# Patient Record
Sex: Male | Born: 1985
Health system: Southern US, Community
[De-identification: ages and names within clinical notes are randomized; demographics above are authoritative.]

## PROBLEM LIST (undated history)

## (undated) DIAGNOSIS — I509 Heart failure, unspecified: Secondary | ICD-10-CM

## (undated) DIAGNOSIS — L02419 Cutaneous abscess of limb, unspecified: Secondary | ICD-10-CM

## (undated) HISTORY — DX: Heart failure, unspecified: I50.9

---

## 2012-07-07 ENCOUNTER — Encounter (HOSPITAL_COMMUNITY): Payer: Self-pay | Admitting: Emergency Medicine

## 2012-07-07 ENCOUNTER — Emergency Department (HOSPITAL_COMMUNITY)
Admission: EM | Admit: 2012-07-07 | Discharge: 2012-07-07 | Disposition: A | Discharge status: Home / Self Care | Payer: Self-pay | Attending: Emergency Medicine | Admitting: Emergency Medicine

## 2012-07-07 DIAGNOSIS — IMO0002 Reserved for concepts with insufficient information to code with codable children: Secondary | ICD-10-CM | POA: Insufficient documentation

## 2012-07-07 DIAGNOSIS — L02419 Cutaneous abscess of limb, unspecified: Secondary | ICD-10-CM

## 2012-07-07 DIAGNOSIS — Z91013 Allergy to seafood: Secondary | ICD-10-CM | POA: Insufficient documentation

## 2012-07-07 DIAGNOSIS — F17200 Nicotine dependence, unspecified, uncomplicated: Secondary | ICD-10-CM | POA: Insufficient documentation

## 2012-07-07 MED ORDER — HYDROCODONE-ACETAMINOPHEN 5-325 MG PO TABS: 1.0000 | ORAL_TABLET | Freq: Four times a day (QID) | ORAL | Status: AC | PRN

## 2012-07-07 NOTE — ED Provider Notes (Signed)
History     CSN: [id number]  Arrival date & time 07/07/12  1726   First MD Initiated Contact with Patient 07/07/12 2117      Chief Complaint  Patient presents with  . Abscess    (Consider location/radiation/quality/duration/timing/severity/associated sxs/prior treatment) HPI Comments: Patient has emergency department with chief complaint of abscess.  Onset of symptoms for about 3-4 days ago under the right axilla.  Patient states pain is 10/10 in severity and denies it current drainage, fevers, night sweats, chills, surrounding erythema, or warmth.  Patient is a 26 y.o. male presenting with abscess. The history is provided by the patient.  Abscess     History reviewed. No pertinent past medical history.  History reviewed. No pertinent past surgical history.  History reviewed. No pertinent family history.  History  Substance Use Topics  . Smoking status: Current Some Day Smoker    Types: Cigars  . Smokeless tobacco: Not on file  . Alcohol Use: Yes     occasion      Review of Systems  All other systems reviewed and are negative.    Allergies  Shrimp  Home Medications  No current outpatient prescriptions on file.  BP 129/72  Pulse 70  Temp 98.3 F (36.8 C) (Oral)  Resp 16  SpO2 98%  Physical Exam  Nursing note and vitals reviewed. Constitutional: He is oriented to person, place, and time. He appears well-developed and well-nourished. He does not have a sickly appearance. He does not appear ill. No distress.  HENT:  Head: Normocephalic and atraumatic.  Eyes: Conjunctivae and EOM are normal.  Neck: Normal range of motion. Neck supple.  Cardiovascular: Normal rate and regular rhythm.   Pulmonary/Chest: Effort normal and breath sounds normal.  Musculoskeletal: He exhibits no edema.  Lymphadenopathy:       Head (right side): No submental, no preauricular and no posterior auricular adenopathy present.       Head (left side): No submental, no submandibular,  no preauricular and no posterior auricular adenopathy present.    He has no axillary adenopathy.  Neurological: He is alert and oriented to person, place, and time.  Skin: Skin is warm and dry. No rash noted. He is not diaphoretic.       4x2cm sized abscess located on right axilla. Extreme tenderness to palpation. Not currently draining. Abscess is fluctuant without warmth, surrounding erythema, or induration.    ED Course  Procedures (including critical care time)  Labs Reviewed - No data to display No results found.   No diagnosis found. INCISION AND DRAINAGE Performed by: Jaci Carrel Consent: Verbal consent obtained. Risks and benefits: risks, benefits and alternatives were discussed Type: abscess  Body area: Right axilla  Anesthesia: local infiltration  Local anesthetic: lidocaine 2 % with epinephrine  Anesthetic total: 2 ml  Complexity: complex Blunt dissection to break up loculations  Drainage: purulent  Drainage amount: Moderate   Packing material: 1/4 in iodoform gauze  Patient tolerance: Patient tolerated the procedure well with no immediate complications.  '   MDM  Axilla abscess  Patient with skin abscess amenable to incision and drainage.  Abscess was large enough to warrant packing with removal and wound recheck in 2 days. No signs of cellulitis is surrounding skin.  Will d/c to home.  No antibiotic therapy is indicated.         Jaci Carrel, [place] 07/07/12 2216

## 2012-07-07 NOTE — ED Notes (Signed)
Pt reports for past 3-4 days noticed bump under R axilla; pt states last weekend had same but popped it and went away; pt has abscess to R axilla; red and firm to touch

## 2012-07-07 NOTE — ED Notes (Signed)
Spoke with PA, okay with all abscesses coming to [facility] as long as not perirectal

## 2012-07-08 NOTE — ED Provider Notes (Signed)
Medical screening examination/treatment/procedure(s) were performed by non-physician practitioner and as supervising physician I was immediately available for consultation/collaboration.  Aniko Finnigan, MD 07/08/12 0023 

## 2012-07-11 ENCOUNTER — Encounter (HOSPITAL_COMMUNITY): Payer: Self-pay | Admitting: *Deleted

## 2012-07-11 ENCOUNTER — Emergency Department (INDEPENDENT_AMBULATORY_CARE_PROVIDER_SITE_OTHER)
Admission: EM | Admit: 2012-07-11 | Discharge: 2012-07-11 | Disposition: A | Discharge status: Home / Self Care | Payer: Self-pay | Source: Home / Self Care | Attending: Emergency Medicine | Admitting: Emergency Medicine

## 2012-07-11 DIAGNOSIS — L0291 Cutaneous abscess, unspecified: Secondary | ICD-10-CM

## 2012-07-11 DIAGNOSIS — L0390 Cellulitis, unspecified: Secondary | ICD-10-CM

## 2012-07-11 HISTORY — DX: Cutaneous abscess of limb, unspecified: L02.419

## 2012-07-11 NOTE — ED Notes (Signed)
States here for packing removal; had I & D right axilla on 7/23.  States feeling much better.  Denies taking any abx; stopped taking Vicodin few days ago.

## 2012-07-11 NOTE — ED Provider Notes (Signed)
Chief Complaint  Patient presents with  . Abscess    History of Present Illness:    The patient is a 26 year old male who returns for removal of packing from an abscess in the right axilla. This was incised and drained 4 days ago the emergency department. He was not placed on antibiotic and no culture was done. He states the abscess feels better but is still a little bit irritated. He denies any fever or chills.  Review of Systems:  Other than noted above, the patient denies any of the following symptoms: Systemic:  No fever, chills or sweats. Skin:  No rash or itching.  PMFSH:  Past medical history, family history, social history, meds, and allergies were reviewed.  No history of diabetes or prior history of abscesses or MRSA.  Physical Exam:   Vital signs:  BP 100/66  Pulse 64  Temp 98.3 F (36.8 C) (Oral)  Resp 16  SpO2 100% Skin:  He has an abscess in his right axilla with packing in place, status post I&D. There still a little surrounding induration and slight pain to palpation.  Skin exam was otherwise normal.  No rash. Ext:  Distal pulses were full, patient has full ROM of all joints.  Procedure:  Verbal informed consent was obtained.  The patient was informed of the risks and benefits of the procedure and understands and accepts.  Identity of the patient was verified verbally and by wristband.   The packing was removed. The wound cavity appears clean with no exudate or drainage. This was then cleansed with saline, antibiotic ointment was applied and a sterile dry dressing. He was instructed in wound care.  Assessment:  The encounter diagnosis was Abscess.  Plan:   1.  The following meds were prescribed:   New Prescriptions   No medications on file   2.  The patient was instructed in symptomatic care and handouts were given. 3.  The patient was instructed to change the dressing once or twice a day and return again as needed.  Reuben Likes, MD 07/11/12 1726

## 2012-07-24 ENCOUNTER — Emergency Department (HOSPITAL_COMMUNITY)
Admission: EM | Admit: 2012-07-24 | Discharge: 2012-07-24 | Disposition: A | Discharge status: Home / Self Care | Payer: Self-pay | Attending: Emergency Medicine | Admitting: Emergency Medicine

## 2012-07-24 ENCOUNTER — Encounter (HOSPITAL_COMMUNITY): Payer: Self-pay

## 2012-07-24 DIAGNOSIS — L02411 Cutaneous abscess of right axilla: Secondary | ICD-10-CM

## 2012-07-24 DIAGNOSIS — IMO0002 Reserved for concepts with insufficient information to code with codable children: Secondary | ICD-10-CM | POA: Insufficient documentation

## 2012-07-24 DIAGNOSIS — F17200 Nicotine dependence, unspecified, uncomplicated: Secondary | ICD-10-CM | POA: Insufficient documentation

## 2012-07-24 MED ORDER — CLINDAMYCIN HCL 150 MG PO CAPS: 450.0000 mg | ORAL_CAPSULE | Freq: Two times a day (BID) | ORAL | Status: AC

## 2012-07-24 MED ORDER — HYDROCODONE-ACETAMINOPHEN 5-325 MG PO TABS: 2.0000 | ORAL_TABLET | ORAL | Status: AC | PRN

## 2012-07-24 NOTE — ED Provider Notes (Signed)
History     CSN: [id number]  Arrival date & time 07/24/12  0803   First MD Initiated Contact with Patient 07/24/12 0900      Chief Complaint  Patient presents with  . Abscess    (Consider location/radiation/quality/duration/timing/severity/associated sxs/prior treatment) Patient is a 26 y.o. male presenting with abscess. The history is provided by the patient and medical records.  Abscess  Pertinent negatives include no fever and no vomiting.   Marcus Hernandez is a 26 y.o. male presents to the emergency department complaining of multiple abscesses in the R axilla.  He recently had one in the same area that is healing.  The onset of the symptoms was  gradual starting 5 days ago.  The patient has associated pain.  The symptoms have been  persistent, gradually worsened.  Movement, clothing makes the symptoms worse and nothing makes symptoms better.  The patient denies fever, chills, diaphoresis, dizziness, lightneadeness.  He has taken pain pills for management of the discomfort.  .    The patient has medical history significant for:  Past Medical History  Diagnosis Date  . Axillary abscess      Past Medical History  Diagnosis Date  . Axillary abscess     History reviewed. No pertinent past surgical history.  No family history on file.  History  Substance Use Topics  . Smoking status: Current Some Day Smoker    Types: Cigars  . Smokeless tobacco: Not on file  . Alcohol Use: Yes     occasion      Review of Systems  Constitutional: Negative for fever.  Gastrointestinal: Negative for nausea and vomiting.  Skin: Positive for rash (abscess).    Allergies  Shrimp  Home Medications   Current Outpatient Rx  Name Route Sig Dispense Refill  . HYDROCODONE-ACETAMINOPHEN 5-325 MG PO TABS Oral Take 1 tablet by mouth every 6 (six) hours as needed. For pain      BP 110/68  Pulse 65  Temp 97.8 F (36.6 C) (Oral)  Resp 20  Ht 5\' 8"  (1.727 m)  Wt 140 lb (63.504 kg)   BMI 21.29 kg/m2  SpO2 100%  Physical Exam  Nursing note and vitals reviewed. Constitutional: He appears well-developed and well-nourished. No distress.  HENT:  Head: Normocephalic and atraumatic.  Eyes: Conjunctivae are normal. No scleral icterus.  Cardiovascular: Normal rate and regular rhythm.   Pulmonary/Chest: Effort normal and breath sounds normal.  Neurological: He is alert.  Skin: Skin is warm and dry. Rash ( abscess in the R axilla x 2, erythematous and tender to palpation.  Minimal accompanying cellulitis) noted. He is not diaphoretic. There is erythema.    ED Course  Procedures (including critical care time)  Labs Reviewed - No data to display No results found.  INCISION AND DRAINAGE Performed by: Dierdre Forth Consent: Verbal consent obtained. Risks and benefits: risks, benefits and alternatives were discussed Type: abscess x2  Body area: axilla  Anesthesia: local infiltration  Local anesthetic: lidocaine 2% without epinephrine  Anesthetic total: 3 ml  Complexity: complex Blunt dissection to break up loculations  Drainage: purulent  Drainage amount: 5cc between the 2 places  Packing material: 1/4 in iodoform gauze in each abscess  Patient tolerance: Patient tolerated the procedure well with no immediate complications.  1. Abscess of axilla, right       MDM  Marcus Hernandez presents with 2 abscesses under the right axilla.  This is a recurrent problem particularly in this area of the  body he has had I&D of these areas before.  I&D of both areas today without complication today. Discharge home with an antibiotic and pain medicine.  Indications to followup immediately to the emergency room and he states understanding.    1. Medications: clindamycin 2. Treatment: warm compress, change bandage as needed 3. Follow Up: with ER for re-check and packing removal in 2 days         Dierdre Forth, PA-C 07/24/12 1123

## 2012-07-24 NOTE — ED Notes (Signed)
I and D done by [facility], patient tolerated the procedure well.

## 2012-07-24 NOTE — ED Notes (Signed)
Patient presented to the ER with complaint of abscess to rt armpit. Patient denies any fever. Patient was seen in the ER 2 weeks ago for the same problem. NAD.

## 2012-07-28 NOTE — ED Provider Notes (Signed)
Medical screening examination/treatment/procedure(s) were performed by non-physician practitioner and as supervising physician I was immediately available for consultation/collaboration.   Laray Anger, DO 07/28/12 [phone]

## 2020-08-11 ENCOUNTER — Other Ambulatory Visit: Payer: Self-pay

## 2020-08-11 DIAGNOSIS — Z20822 Contact with and (suspected) exposure to covid-19: Secondary | ICD-10-CM

## 2020-08-12 LAB — SARS-COV-2, NAA 2 DAY TAT

## 2020-08-12 LAB — NOVEL CORONAVIRUS, NAA: SARS-CoV-2, NAA: DETECTED — AB

## 2020-10-20 ENCOUNTER — Other Ambulatory Visit: Payer: Self-pay

## 2020-10-20 ENCOUNTER — Emergency Department (HOSPITAL_BASED_OUTPATIENT_CLINIC_OR_DEPARTMENT_OTHER)
Admission: EM | Admit: 2020-10-20 | Discharge: 2020-10-21 | Disposition: A | Discharge status: Home / Self Care | Payer: Self-pay | Attending: Emergency Medicine | Admitting: Emergency Medicine

## 2020-10-20 ENCOUNTER — Emergency Department (HOSPITAL_BASED_OUTPATIENT_CLINIC_OR_DEPARTMENT_OTHER): Payer: Self-pay

## 2020-10-20 ENCOUNTER — Encounter (HOSPITAL_BASED_OUTPATIENT_CLINIC_OR_DEPARTMENT_OTHER): Payer: Self-pay

## 2020-10-20 DIAGNOSIS — R079 Chest pain, unspecified: Secondary | ICD-10-CM | POA: Insufficient documentation

## 2020-10-20 DIAGNOSIS — Z8616 Personal history of COVID-19: Secondary | ICD-10-CM | POA: Insufficient documentation

## 2020-10-20 DIAGNOSIS — R0602 Shortness of breath: Secondary | ICD-10-CM | POA: Insufficient documentation

## 2020-10-20 DIAGNOSIS — Z87891 Personal history of nicotine dependence: Secondary | ICD-10-CM | POA: Insufficient documentation

## 2020-10-20 LAB — BASIC METABOLIC PANEL
Anion gap: 11 (ref 5–15)
BUN: 10 mg/dL (ref 6–20)
CO2: 26 mmol/L (ref 22–32)
Calcium: 9.8 mg/dL (ref 8.9–10.3)
Chloride: 99 mmol/L (ref 98–111)
Creatinine, Ser: 1.17 mg/dL (ref 0.61–1.24)
GFR, Estimated: >60 mL/min (ref >60)
Glucose, Bld: 111 mg/dL — ABNORMAL HIGH (ref 70–99)
Potassium: 4.1 mmol/L (ref 3.5–5.1)
Sodium: 136 mmol/L (ref 135–145)

## 2020-10-20 LAB — CBC
HCT: 45.8 % (ref 39.0–52.0)
Hemoglobin: 14.4 g/dL (ref 13.0–17.0)
MCH: 23.2 pg — ABNORMAL LOW (ref 26.0–34.0)
MCHC: 31.4 g/dL (ref 30.0–36.0)
MCV: 73.8 fL — ABNORMAL LOW (ref 80.0–100.0)
Platelets: 349 10*3/uL (ref 150–400)
RBC: 6.21 MIL/uL — ABNORMAL HIGH (ref 4.22–5.81)
RDW: 15.7 % — ABNORMAL HIGH (ref 11.5–15.5)
WBC: 7.7 10*3/uL (ref 4.0–10.5)
nRBC: 0 % (ref 0.0–0.2)

## 2020-10-20 LAB — TROPONIN I (HIGH SENSITIVITY)
Troponin I (High Sensitivity): <2 ng/L (ref <18)
Troponin I (High Sensitivity): 2 ng/L (ref <18)

## 2020-10-20 LAB — D-DIMER, QUANTITATIVE: D-Dimer, Quant: 0.32 ug/mL-FEU (ref 0.00–0.50)

## 2020-10-20 LAB — CBC WITH DIFFERENTIAL/PLATELET
Abs Immature Granulocytes: 0.02 10*3/uL (ref 0.00–0.07)
Basophils Absolute: 0 10*3/uL (ref 0.0–0.1)
Basophils Relative: 0 %
Eosinophils Absolute: 0 10*3/uL (ref 0.0–0.5)
Eosinophils Relative: 0 %
HCT: 42.4 % (ref 39.0–52.0)
Hemoglobin: 14.5 g/dL (ref 13.0–17.0)
Immature Granulocytes: 0 %
Lymphocytes Relative: 26 %
Lymphs Abs: 2 10*3/uL (ref 0.7–4.0)
MCH: 25.4 pg — ABNORMAL LOW (ref 26.0–34.0)
MCHC: 34.2 g/dL (ref 30.0–36.0)
MCV: 74.3 fL — ABNORMAL LOW (ref 80.0–100.0)
Monocytes Absolute: 0.9 10*3/uL (ref 0.1–1.0)
Monocytes Relative: 12 %
Neutro Abs: 4.6 10*3/uL (ref 1.7–7.7)
Neutrophils Relative %: 62 %
Platelets: 329 10*3/uL (ref 150–400)
RBC: 5.71 MIL/uL (ref 4.22–5.81)
RDW: 15.8 % — ABNORMAL HIGH (ref 11.5–15.5)
WBC: 7.5 10*3/uL (ref 4.0–10.5)
nRBC: 0 % (ref 0.0–0.2)

## 2020-10-20 MED ORDER — ONDANSETRON HCL 4 MG/2ML IJ SOLN
4.0000 mg | Freq: Once | INTRAMUSCULAR | Status: DC
  Filled 2020-10-20: qty 2

## 2020-10-20 MED ORDER — KETOROLAC TROMETHAMINE 30 MG/ML IJ SOLN
30.0000 mg | Freq: Once | INTRAMUSCULAR | Status: AC
  Administered 2020-10-20: 30 mg via INTRAVENOUS
  Filled 2020-10-20: qty 1

## 2020-10-20 NOTE — ED Provider Notes (Signed)
[facility] EMERGENCY DEPARTMENT Provider Note   CSN: [id number] Arrival date & time: 10/20/20  2011     History Chief Complaint  Patient presents with  . Chest Pain    Marcus Hernandez is a 34 y.o. male with no significant past medical history presents for evaluation of chest pain and shortness of breath.  He reports over the last year or so, he has had intermittent shortness of breath.  He states that over the last week, he felt like it is gotten worse.  He states that about a week ago, he started experiencing chest pain.  He states that chest pain has been intermittent since then.  He states it occurs randomly is not tied to a specific action.  He states he has had it when he has been at home, laying on the bed, laying on the couch, when he is in his car and another different times.  He states sometimes he has it after eating.  He states is not specifically tied to when he does any exertional activity.  He reports that when he gets it, he does not have any associated diaphoresis, nausea/vomiting.  He states that it will last for different amounts of time.  Sometimes it is a second, sometimes it is more of a minute sometimes it takes an hour or so.  He states that sometimes it feels like a heating pain, a sharp pain sometimes a squeezing pain.  He states he does not take any medications for the symptoms.  He went to urgent care on 10/17/2020 and was evaluated for the symptoms.  He states that he was discharged with paperwork that states that he was diagnosed with GERD as well as stating that he had a thyroid goiter and unspecified chest pain.  He states he still does not know what is causing his pain.  He states he has continued to have some intermittent episodes.  He states that shortness of breath happens when he is sitting and when he is walking as well.  He states that the chest pain is intermittently occurring.  He states that when he has it is not worse with exertion.  He states  sometimes the pain is worse with deep inspiration sometimes it is not.  He has not any fevers, cough, chills.  Denies any abdominal pain, nausea/vomiting.  He reports that sometimes he will get a tingling sensation in his chest when he gets symptoms but currently does not have it.  He states he had Covid a couple months ago.  He denies any exogenous hormone use, recent immobilization, prior history of DVT/PE, recent surgery, leg swelling, or long travel.  He does not smoke.  Denies any cocaine, heroin, marijuana use.  No personal cardiac history.  Does not think he has had any family history of MI before the age of 26.  No history of hypertension, diabetes.  The history is provided by the patient.       Past Medical History:  Diagnosis Date  . Axillary abscess     There are no problems to display for this patient.   History reviewed. No pertinent surgical history.     No family history on file.  Social History   Tobacco Use  . Smoking status: Former Smoker    Types: Cigars  . Smokeless tobacco: Never Used  Vaping Use  . Vaping Use: Never used  Substance Use Topics  . Alcohol use: Yes    Comment: weekly  . Drug use:  No    Home Medications Prior to Admission medications   Medication Sig Start Date End Date Taking? Authorizing Provider  HYDROcodone-acetaminophen (NORCO/VICODIN) 5-325 MG per tablet Take 1 tablet by mouth every 6 (six) hours as needed. For pain    [provider]    Allergies    Shrimp [shellfish allergy]  Review of Systems   Review of Systems  Constitutional: Negative for fever.  Respiratory: Positive for shortness of breath. Negative for cough.   Cardiovascular: Positive for chest pain. Negative for leg swelling.  Gastrointestinal: Negative for abdominal pain, nausea and vomiting.  Genitourinary: Negative for dysuria and hematuria.  Neurological: Negative for headaches.  All other systems reviewed and are negative.   Physical Exam Updated  Vital Signs BP 121/66 (BP Location: Left Arm)   Pulse 87   Temp 99.7 F (37.6 C) (Oral)   Resp 18   Ht 5\' 7"  (1.702 m)   Wt 75.8 kg   SpO2 100%   BMI 26.16 kg/m   Physical Exam Vitals and nursing note reviewed.  Constitutional:      Appearance: Normal appearance. He is well-developed.  HENT:     Head: Normocephalic and atraumatic.  Eyes:     General: Lids are normal.     Conjunctiva/sclera: Conjunctivae normal.     Pupils: Pupils are equal, round, and reactive to light.  Cardiovascular:     Rate and Rhythm: Normal rate and regular rhythm.     Pulses: Normal pulses.          Radial pulses are 2+ on the right side and 2+ on the left side.       Dorsalis pedis pulses are 2+ on the right side and 2+ on the left side.     Heart sounds: Normal heart sounds. No murmur heard.  No friction rub. No gallop.   Pulmonary:     Effort: Pulmonary effort is normal.     Breath sounds: Normal breath sounds.     Comments: Lungs clear to auscultation bilaterally.  Symmetric chest rise.  No wheezing, rales, rhonchi. Abdominal:     Palpations: Abdomen is soft. Abdomen is not rigid.     Tenderness: There is no abdominal tenderness. There is no guarding.     Comments: Abdomen is soft, non-distended, non-tender. No rigidity, No guarding. No peritoneal signs.  Musculoskeletal:        General: Normal range of motion.     Cervical back: Full passive range of motion without pain.  Skin:    General: Skin is warm and dry.     Capillary Refill: Capillary refill takes less than 2 seconds.  Neurological:     Mental Status: He is alert and oriented to person, place, and time.  Psychiatric:        Speech: Speech normal.     ED Results / Procedures / Treatments   Labs (all labs ordered are listed, but only abnormal results are displayed) Labs Reviewed  BASIC METABOLIC PANEL - Abnormal; Notable for the following components:      Result Value   Glucose, Bld 111 (*)    All other components within  normal limits  CBC - Abnormal; Notable for the following components:   RBC 6.21 (*)    MCV 73.8 (*)    MCH 23.2 (*)    RDW 15.7 (*)    All other components within normal limits  CBC WITH DIFFERENTIAL/PLATELET - Abnormal; Notable for the following components:   MCV 74.3 (*)  MCH 25.4 (*)    RDW 15.8 (*)    All other components within normal limits  D-DIMER, QUANTITATIVE (NOT AT [facility])  TROPONIN I (HIGH SENSITIVITY)  TROPONIN I (HIGH SENSITIVITY)    EKG EKG Interpretation  Date/Time:  Friday October 20 2020 20:16:01 EDT Ventricular Rate:  109 PR Interval:  136 QRS Duration: 90 QT Interval:  318 QTC Calculation: 428 R Axis:   96 Text Interpretation: Sinus tachycardia Right atrial enlargement Rightward axis Borderline ECG No old tracing to compare Confirmed by Malvin Johns [phone]) on 10/20/2020 10:54:37 PM   Radiology DG Chest 2 View  Result Date: 10/20/2020 CLINICAL DATA:  Mid chest pain and shortness of breath. EXAM: CHEST - 2 VIEW COMPARISON:  None. FINDINGS: The cardiomediastinal contours are normal. The lungs are clear. Pulmonary vasculature is normal. No consolidation, pleural effusion, or pneumothorax. No acute osseous abnormalities are seen. IMPRESSION: Negative radiographs of the chest. Electronically Signed   By: Keith Rake M.D.   On: 10/20/2020 20:58    Procedures Procedures (including critical care time)  Medications Ordered in ED Medications  ondansetron (ZOFRAN) injection 4 mg (4 mg Intravenous Not Given 10/20/20 2217)  ketorolac (TORADOL) 30 MG/ML injection 30 mg (30 mg Intravenous Given 10/20/20 2209)    ED Course  I have reviewed the triage vital signs and the nursing notes.  Pertinent labs & imaging results that were available during my care of the patient were reviewed by me and considered in my medical decision making (see chart for details).    MDM Rules/Calculators/A&P                          34 year old male who presents for evaluation  of chest pain, shortness of breath.  Reports shortness of breath has been intermittently occurring for about a year.  Chest pain started about a week ago.  It is not tied to any specific action.  He states it will resolve on its own.  He saw urgent care on 10/17/2020 but states that he does not know what is causing his pain.  Initial arrival, he is afebrile nontoxic-appearing.  Vital signs are stable.  On exam, lungs clear to auscultation.  Low suspicion for ACS etiology as he has atypical features.  History/physical exam is x-ray for dissection.  Low suspicion for PE as he does not have any risk factors but had recent COVID-19 diagnosis and does report some shortness of breath pleuritic chest pain.  We will plan check labs, dimer, EKG, chest x-ray.  I reviewed his work-up from urgent care.  He had TSH, CBC, BMP done as well as a chest x-ray.  There was some concern on the chest x-ray that he may have a thyroid goiter which was listed as one of his diagnoses.  I do not see any evidence of troponin ordered.  Initial troponin negative.  D-dimer is negative.  CBC shows no leukocytosis or anemia.  RBC count is 5.71 here today.  BMP shows BUN and creatinine within normal limits.  Chest x-ray negative for any acute abnormalities.  Delta troponin negative.  Discussed results with patient.  He is sitting comfortably on bed with no signs of distress.  Repeat exam is benign.  He is hemodynamically stable here in the ED.  I did extensive discussion with him regarding his work-up here in ED.  At this time, given his reassuring work-up, do not feel he needs further evaluation in the emergency department.  He  does report that he does not feel as bad as has been.  We discussed that there may be an anxiety component and he does report that he feels like sometimes he will start to think about things and he cannot turn his mind off.  He does not have a primary care doctor.  I discussed with him that it might be helpful in  obtaining a primary care doctor to follow-up on the referred thyroid issues that were seen at the previous urgent care as well as potentially getting a Holter monitor to evaluate when he does have symptoms. At this time, patient exhibits no emergent life-threatening condition that require further evaluation in ED. Discussed patient with Dr. Tamera Punt who is agreeable to plan. Patient had ample opportunity for questions and discussion. All patient's questions were answered with full understanding. Strict return precautions discussed. Patient expresses understanding and agreement to plan.   Portions of this note were generated with [employer]. Dictation errors may occur despite best attempts at proofreading.    Final Clinical Impression(s) / ED Diagnoses Final diagnoses:  Nonspecific chest pain    Rx / DC Orders ED Discharge Orders    None       Volanda Napoleon, PA-C 10/21/20 0049    Malvin Johns, MD 10/21/20 [phone]

## 2020-10-20 NOTE — ED Notes (Signed)
Patient transported to X-ray 

## 2020-10-20 NOTE — ED Notes (Signed)
Pt ambulated with pulse ox. O2 saturation 97% or above. HR remained between 90-101.

## 2020-10-20 NOTE — ED Triage Notes (Signed)
Pt c/o CP, SOB x 3-4 days-denies fever/flu sx-NAD-steady gait

## 2020-10-21 NOTE — ED Notes (Signed)
Discharge instructions discussed. Follow up care reviewed. Departs ED at this time in stable condition with instructions on when or if to return.

## 2020-10-21 NOTE — Discharge Instructions (Signed)
Your workup today looked reassuring.   As we discussed, based on your previous work-up from urgent care, you have a thyroid goiter/nodule.  This needs to be followed up by your primary care doctor.  As we discussed, it also may be beneficial to get a primary care doctor to do a Holter monitor if you continue of symptoms.  Return the emergency department for any worsening pain, difficulty breathing, numbness/weakness of your arms or legs, vomiting, abdominal pain or any other worsening concerning symptoms.

## 2020-10-31 ENCOUNTER — Other Ambulatory Visit: Payer: Self-pay

## 2020-10-31 ENCOUNTER — Other Ambulatory Visit: Payer: Self-pay | Admitting: Internal Medicine

## 2020-10-31 ENCOUNTER — Ambulatory Visit: Payer: Self-pay | Attending: Internal Medicine | Admitting: Internal Medicine

## 2020-10-31 ENCOUNTER — Encounter: Payer: Self-pay | Admitting: Internal Medicine

## 2020-10-31 VITALS — BP 120/79 | HR 96 | Resp 16 | Ht 66.0 in | Wt 160.6 lb

## 2020-10-31 DIAGNOSIS — E010 Iodine-deficiency related diffuse (endemic) goiter: Secondary | ICD-10-CM

## 2020-10-31 DIAGNOSIS — R1314 Dysphagia, pharyngoesophageal phase: Secondary | ICD-10-CM

## 2020-10-31 DIAGNOSIS — Z2821 Immunization not carried out because of patient refusal: Secondary | ICD-10-CM

## 2020-10-31 DIAGNOSIS — R634 Abnormal weight loss: Secondary | ICD-10-CM

## 2020-10-31 DIAGNOSIS — R0602 Shortness of breath: Secondary | ICD-10-CM

## 2020-10-31 DIAGNOSIS — R079 Chest pain, unspecified: Secondary | ICD-10-CM

## 2020-10-31 MED ORDER — SERTRALINE HCL 50 MG PO TABS: ORAL_TABLET | ORAL | 2 refills | Status: DC

## 2020-10-31 MED FILL — SERTRALINE HCL 50 MG TABLET: 50 | 20 days supply | Qty: 10 | Fill #0

## 2020-10-31 NOTE — Progress Notes (Signed)
Patient ID: NASEEM VARDEN, male    DOB: [birth year]  MRN: [id number]  CC: Hospitalization Follow-up (ED)   Subjective: Zacariah Morua is a 34 y.o. male who presents for new pt visit. Male friend is with him.  His concerns today include:   No previous PCP. No previous chronic ds, not on any meds Had COVID end of August 2021  Presents today for eval of SOB and chest pain.  Seen in ER 12/21/2019 for same.  The PA who saw him took an excellent history which I have reviewed.  Studies including CBC, troponin, D-dimer, chemistry were normal.  Chest x-ray revealed no acute disease.  EKG revealed borderline LVH and sinus tachycardia.  There was no old previous EKG to compare.  He was assessed to possibly have an anxiety component and was told to establish with a PCP.  Today:  CP started end of October SOB intermittent x1-1.5 yrs. Associated with tingling in various parts of the body lasting 1-2 mins.  He states that most of the tingling occurs in the anterior chest and in his stomach.   Symptoms have occurred daily since end of October. SOB more when moving around but can come on when sitting or laying down,riding in his car and when taking a shower.  The chest discomfort he describes as a tightness and pressure but sometimes it can be sharp and other times can feel like a strain.   Most noticeable when taking a shower and when riding in his car. Does [employer].  Sometimes while in his car he can feel his throat tightening up and he has to get out of the car or drive out of the area that he is in.  Symptoms disappear after several minutes.  He also endorses episodes of waking up in the middle of the night feeling like he cannot breathe.  Feeling goes away once he gets up and drink some water.  He has not been going out much in the past few weeks because he is worried about when the next episode may occur. No cough or fever.  Rode to [facility] this wkend.  No swelling in legs.  He is  not very active. Endorses increased stress over the past 1 year.   Reports his weight was 171 pounds 2 weeks ago.  Today he weighs 160 pounds.  He reports that he is not eating due to decreased appetite.  He also reports feeling of food being stuck in his throat when he tries to eat for the past 1 week.  He sometimes gets the feeling of chest pains or shortness of breath when he starts to eat.  He denies any abdominal pain.  No blood in his stools.  Denies any street drug use.  He does not smoke.  He has 3 kids. Sister has overactive thyroid.  He brings a copy of labs that he had done on 10/17/2020 at an urgent care facility call [facility].  TSH was normal at 2.9, chemistry was normal.  Lipase normal.  CBC normal.  Past medical, surgical, social and family history reviewed.  Patient Active Problem List   Diagnosis Date Noted  . Influenza vaccine refused 10/31/2020     Current Outpatient Medications on File Prior to Visit  Medication Sig Dispense Refill  . HYDROcodone-acetaminophen (NORCO/VICODIN) 5-325 MG per tablet Take 1 tablet by mouth every 6 (six) hours as needed. For pain (Patient not taking: Reported on 10/31/2020)     No  current facility-administered medications on file prior to visit.    Allergies  Allergen Reactions  . Shrimp [Shellfish Allergy] Swelling    To lips    Social History   Socioeconomic History  . Marital status: Single    Spouse name: Not on file  . Number of children: Not on file  . Years of education: Not on file  . Highest education level: Not on file  Occupational History  . Not on file  Tobacco Use  . Smoking status: Former Smoker    Types: Cigars  . Smokeless tobacco: Never Used  Vaping Use  . Vaping Use: Never used  Substance and Sexual Activity  . Alcohol use: Yes    Comment: weekly  . Drug use: No  . Sexual activity: Not on file  Other Topics Concern  . Not on file  Social History Narrative  . Not on file   Social  Determinants of Health   Financial Resource Strain:   . Difficulty of Paying Living Expenses: Not on file  Food Insecurity:   . Worried About Charity fundraiser in the Last Year: Not on file  . Ran Out of Food in the Last Year: Not on file  Transportation Needs:   . Lack of Transportation (Medical): Not on file  . Lack of Transportation (Non-Medical): Not on file  Physical Activity:   . Days of Exercise per Week: Not on file  . Minutes of Exercise per Session: Not on file  Stress:   . Feeling of Stress : Not on file  Social Connections:   . Frequency of Communication with Friends and Family: Not on file  . Frequency of Social Gatherings with Friends and Family: Not on file  . Attends Religious Services: Not on file  . Active Member of Clubs or Organizations: Not on file  . Attends Archivist Meetings: Not on file  . Marital Status: Not on file  Intimate Partner Violence:   . Fear of Current or Ex-Partner: Not on file  . Emotionally Abused: Not on file  . Physically Abused: Not on file  . Sexually Abused: Not on file    No family history on file.  No past surgical history on file.  ROS: Review of Systems Negative except as stated above  PHYSICAL EXAM: BP 120/79   Pulse 96   Resp 16   Ht 5\' 6"  (1.676 m)   Wt 160 lb 9.6 oz (72.8 kg)   SpO2 97%   BMI 25.92 kg/m   Wt Readings from Last 3 Encounters:  10/31/20 160 lb 9.6 oz (72.8 kg)  10/20/20 167 lb (75.8 kg)  07/24/12 140 lb (63.5 kg)    Physical Exam   General appearance - alert, well appearing, young to middle-aged African-American male and in no distress Mental status - normal mood, behavior, speech, dress, motor activity, and thought processes Eyes - pupils equal and reactive, extraocular eye movements intact Nose - normal and patent, no erythema, discharge or polyps Mouth - mucous membranes moist, pharynx normal without lesions Neck - supple, no significant adenopathy.  Thyroid is enlarged more  so on the right side.  No thyroid nodules appreciated. Chest - clear to auscultation, no wheezes, rales or rhonchi, symmetric air entry Heart - normal rate, regular rhythm, normal S1, S2, no murmurs, rubs, clicks or gallops Abdomen - soft, nontender, nondistended, no masses or organomegaly Extremities - peripheral pulses normal, no pedal edema, no clubbing or cyanosis Depression screen [facility] 2/9 10/31/2020  Decreased Interest 3  Down, Depressed, Hopeless 1  PHQ - 2 Score 4  Altered sleeping 3  Tired, decreased energy 2  Change in appetite 2  Feeling bad or failure about yourself  1  Trouble concentrating 2  Moving slowly or fidgety/restless 0  Suicidal thoughts 0  PHQ-9 Score 14   GAD 7 : Generalized Anxiety Score 10/31/2020  Nervous, Anxious, on Edge 1  Control/stop worrying 2  Worry too much - different things 2  Trouble relaxing 1  Restless 0  Easily annoyed or irritable 0  Afraid - awful might happen 2  Total GAD 7 Score 8      CMP Latest Ref Rng & Units 10/20/2020  Glucose 70 - 99 mg/dL 111(H)  BUN 6 - 20 mg/dL 10  Creatinine 0.61 - 1.24 mg/dL 1.17  Sodium 135 - 145 mmol/L 136  Potassium 3.5 - 5.1 mmol/L 4.1  Chloride 98 - 111 mmol/L 99  CO2 22 - 32 mmol/L 26  Calcium 8.9 - 10.3 mg/dL 9.8   Lipid Panel  No results found for: CHOL, TRIG, HDL, CHOLHDL, VLDL, LDLCALC, LDLDIRECT  CBC    Component Value Date/Time   WBC 7.5 10/20/2020 2041   RBC 5.71 10/20/2020 2041   HGB 14.5 10/20/2020 2041   HCT 42.4 10/20/2020 2041   PLT 329 10/20/2020 2041   MCV 74.3 (L) 10/20/2020 2041   MCH 25.4 (L) 10/20/2020 2041   MCHC 34.2 10/20/2020 2041   RDW 15.8 (H) 10/20/2020 2041   LYMPHSABS 2.0 10/20/2020 2041   MONOABS 0.9 10/20/2020 2041   EOSABS 0.0 10/20/2020 2041   BASOSABS 0.0 10/20/2020 2041    ASSESSMENT AND PLAN: 1. Chest pain in adult 2. SOB (shortness of breath) -Symptoms seem most consistent with anxiety/panic disorder.  I summarized his symptoms and explained  to him why I think this may be anxiety.  He asked about long-haul a syndrome given that he had COVID-19 infection back in August not requiring hospitalization.  However most of his symptoms per his history started even before he had Covid.  I recommend starting low-dose of Zoloft to see if symptoms will improve.  Patient is agreeable to giving the medication a try.  I told him that it will take about 3 to 4 weeks before he starts feeling better on the medication.  We will have him take half a pill daily for 3 weeks then increase it to a full pill.  I will also have our LCSW follow-up with him. His EKG does have some LVH so we will refer him to cardiology.  Patient advised to apply for the orange card/cone discount. - Ambulatory referral to Cardiology - sertraline (ZOLOFT) 50 MG tablet; 1/2 tab daily x 3 wks then 1 tab PO daily  Dispense: 30 tablet; Refill: 2  3. Pharyngoesophageal dysphagia 4. Weight loss The weight loss is concerning.  Anxiety may be playing a role here also however given the dysphagia to solids I recommend that we do a barium swallow to evaluate this further.  The thyroid gland does appear enlarged more so on the right side but not to the extent that I see it causing any shift in his trachea. - DG ESOPHAGUS W SINGLE CM (SOL OR THIN BA); Future  5. Thyromegaly Recent TSH normal.  I have kept a copy of those lab results for our records. - US THYROID; Future  6. Influenza vaccine refused This was recommended.  Patient declined       Patient was  given the opportunity to ask questions.  Patient verbalized understanding of the plan and was able to repeat key elements of the plan.   Orders Placed This Encounter  Procedures  . US THYROID  . DG ESOPHAGUS W SINGLE CM (SOL OR THIN BA)  . Ambulatory referral to Cardiology     Requested Prescriptions   Signed Prescriptions Disp Refills  . sertraline (ZOLOFT) 50 MG tablet 30 tablet 2    Sig: 1/2 tab daily x 3 wks then 1 tab  PO daily    Return in about 6 weeks (around 12/12/2020).  Karle Plumber, MD, FACP

## 2020-10-31 NOTE — Patient Instructions (Signed)
I have sent a prescription to your pharmacy for the medication Zoloft.  This is a medication that can be used to treat anxiety.  You will take a half a tablet daily for the first 3 weeks and then after that 1 full tablet a day.  I have referred you to the cardiologist.  I have referred you for barium swallow and for an ultrasound on your thyroid.

## 2020-11-01 ENCOUNTER — Encounter: Payer: Self-pay | Admitting: Internal Medicine

## 2020-11-01 DIAGNOSIS — R0602 Shortness of breath: Secondary | ICD-10-CM | POA: Insufficient documentation

## 2020-11-01 DIAGNOSIS — R1314 Dysphagia, pharyngoesophageal phase: Secondary | ICD-10-CM | POA: Insufficient documentation

## 2020-11-01 DIAGNOSIS — R634 Abnormal weight loss: Secondary | ICD-10-CM | POA: Insufficient documentation

## 2020-11-01 DIAGNOSIS — E010 Iodine-deficiency related diffuse (endemic) goiter: Secondary | ICD-10-CM | POA: Insufficient documentation

## 2020-11-01 DIAGNOSIS — R079 Chest pain, unspecified: Secondary | ICD-10-CM | POA: Insufficient documentation

## 2020-11-06 ENCOUNTER — Other Ambulatory Visit: Payer: Self-pay

## 2020-11-08 ENCOUNTER — Ambulatory Visit: Payer: Self-pay | Attending: Internal Medicine

## 2020-11-08 ENCOUNTER — Other Ambulatory Visit: Payer: Self-pay

## 2020-11-10 ENCOUNTER — Other Ambulatory Visit: Payer: Self-pay | Admitting: Internal Medicine

## 2020-11-10 ENCOUNTER — Ambulatory Visit (HOSPITAL_COMMUNITY)
Admission: RE | Admit: 2020-11-10 | Discharge: 2020-11-10 | Disposition: A | Discharge status: Home / Self Care | Payer: Self-pay | Source: Ambulatory Visit | Attending: Internal Medicine | Admitting: Internal Medicine

## 2020-11-10 ENCOUNTER — Other Ambulatory Visit: Payer: Self-pay

## 2020-11-10 DIAGNOSIS — R1314 Dysphagia, pharyngoesophageal phase: Secondary | ICD-10-CM

## 2020-11-10 DIAGNOSIS — E010 Iodine-deficiency related diffuse (endemic) goiter: Secondary | ICD-10-CM | POA: Insufficient documentation

## 2020-11-12 ENCOUNTER — Other Ambulatory Visit: Payer: Self-pay | Admitting: Internal Medicine

## 2020-11-12 DIAGNOSIS — E010 Iodine-deficiency related diffuse (endemic) goiter: Secondary | ICD-10-CM

## 2020-11-12 NOTE — Progress Notes (Signed)
Cardiology Office Note:    Date:  11/16/2020   ID:  Marcus Hernandez, DOB 04/21/1986, MRN [id number]  PCP:  Ladell Pier, MD  Cardiologist:  No primary care provider on file.  Electrophysiologist:  None   Referring MD: Ladell Pier, MD   Chief Complaint  Patient presents with  . Chest Pain   History of Present Illness:    Marcus Hernandez is a 34 y.o. male with a hx of COVID-19 infection 07/2020 who is referred by Dr. Wynetta Emery for evaluation of chest pain.  He reports that his symptoms started in October.  Describes daily chest pain that occurs at different spots on the chest.  Sometimes can be left-sided, sometimes in the center, and sometimes right-sided pain.  Reports pain is either sharp or tingling sensation.  Typically just lasts for few seconds and resolves.  Sometimes the tingling sensation can last up to 20 minutes.  He does not note anything that causes it.  Does not exercise regularly but has not noted a relationship with exertion.  States that pain occurs at rest.  Also reports that he has been having shortness of breath.  Reports having lightheadedness but denies any syncope.  No lower extremity edema.  Also having palpitations that occur multiple times daily.  Palpitations started the last 2 to 3 weeks.  Occurs at rest.  States that it feels like his heart is racing during the episodes, will last for minutes.  Went to ED on 11/30 for the symptoms.  Troponins were negative.  Was prescribed metoprolol for his palpitations but has not started taking yet.  He smoked cigarettes for 2 to 3 years, 3 to 4 cigarettes/day.  Also smoked cigars for 2 to 3 years, quit in 2014.  No history of heart disease in his immediate family.  Recently found a thyroid nodules, biopsy is being planned.  Normal TSH/free T4 on 11/30   Past Medical History:  Diagnosis Date  . Axillary abscess     No past surgical history on file.  Current Medications: Current Meds  Medication Sig  .  sertraline (ZOLOFT) 50 MG tablet 1/2 tab daily x 3 wks then 1 tab PO daily  . [DISCONTINUED] metoprolol succinate (TOPROL-XL) 50 MG 24 hr tablet Take 1 tablet (50 mg total) by mouth daily.     Allergies:   Shrimp [shellfish allergy]   Social History   Socioeconomic History  . Marital status: Single    Spouse name: Not on file  . Number of children: 3  . Years of education: Not on file  . Highest education level: Not on file  Occupational History  . Occupation: [employer]  Tobacco Use  . Smoking status: Former Smoker    Types: Cigars  . Smokeless tobacco: Never Used  Vaping Use  . Vaping Use: Never used  Substance and Sexual Activity  . Alcohol use: Yes    Comment: weekly  . Drug use: No  . Sexual activity: Not on file  Other Topics Concern  . Not on file  Social History Narrative  . Not on file   Social Determinants of Health   Financial Resource Strain:   . Difficulty of Paying Living Expenses: Not on file  Food Insecurity:   . Worried About Charity fundraiser in the Last Year: Not on file  . Ran Out of Food in the Last Year: Not on file  Transportation Needs:   . Lack of Transportation (Medical): Not on file  .  Lack of Transportation (Non-Medical): Not on file  Physical Activity:   . Days of Exercise per Week: Not on file  . Minutes of Exercise per Session: Not on file  Stress:   . Feeling of Stress : Not on file  Social Connections:   . Frequency of Communication with Friends and Family: Not on file  . Frequency of Social Gatherings with Friends and Family: Not on file  . Attends Religious Services: Not on file  . Active Member of Clubs or Organizations: Not on file  . Attends Archivist Meetings: Not on file  . Marital Status: Not on file     Family History: No known history of heart disease in his immediate family  ROS:   Please see the history of present illness.     All other systems reviewed and are negative.  EKGs/Labs/Other Studies  Reviewed:    The following studies were reviewed today:   EKG:  EKG is ordered today.  The ekg ordered today demonstrates normal sinus rhythm, rate 97, biatrial enlargement  Recent Labs: 10/20/2020: BUN 10; Creatinine, Ser 1.17; Hemoglobin 14.5; Platelets 329; Potassium 4.1; Sodium 136 11/14/2020: TSH 1.170  Recent Lipid Panel No results found for: CHOL, TRIG, HDL, CHOLHDL, VLDL, LDLCALC, LDLDIRECT  Physical Exam:    VS:  BP 112/73   Pulse 100   Ht 5' 6.5" (1.689 m)   Wt 155 lb 9.6 oz (70.6 kg)   SpO2 99%   BMI 24.74 kg/m     Wt Readings from Last 3 Encounters:  11/16/20 155 lb 9.6 oz (70.6 kg)  11/14/20 160 lb 7.9 oz (72.8 kg)  10/31/20 160 lb 9.6 oz (72.8 kg)     GEN: Well nourished, well developed in no acute distress HEENT: Normal NECK: No JVD; No carotid bruits LYMPHATICS: No lymphadenopathy CARDIAC: RRR, no murmurs, rubs, gallops RESPIRATORY:  Clear to auscultation without rales, wheezing or rhonchi  ABDOMEN: Soft, non-tender, non-distended MUSCULOSKELETAL:  No edema; No deformity  SKIN: Warm and dry NEUROLOGIC:  Alert and oriented x 3 PSYCHIATRIC:  Normal affect   ASSESSMENT:    1. Dyspnea, unspecified type   2. Palpitations   3. Chest pain of uncertain etiology    PLAN:    Chest pain: Description suggests noncardiac chest pain, as describes sharp/tingling pain that typically last for few seconds, not related to exertion.  Given age and lack of risk factors, no further cardiac work-up recommended at this time.  Dyspnea: Given symptoms worsening since Covid infection in August, recommend echocardiogram to rule out myocardial involvement  Palpitations: Description concerning for arrhythmia, will check Zio patch x7 days  RTC in 3 months  Medication Adjustments/Labs and Tests Ordered: Current medicines are reviewed at length with the patient today.  Concerns regarding medicines are outlined above.  Orders Placed This Encounter  Procedures  . LONG TERM  MONITOR (3-14 DAYS)  . EKG 12-Lead  . ECHOCARDIOGRAM COMPLETE   No orders of the defined types were placed in this encounter.   Patient Instructions  Medication Instructions:  STOP METOPROLOL  *If you need a refill on your cardiac medications before your next appointment, please call your pharmacy*  Testing/Procedures: Your physician has requested that you have an echocardiogram. Echocardiography is a painless test that uses sound waves to create images of your heart. It provides your doctor with information about the size and shape of your heart and how well your heart's chambers and valves are working. You may receive an ultrasound enhancing agent  through an IV if needed to better visualize your heart during the echo.This procedure takes approximately one hour. There are no restrictions for this procedure. This will take place at the [address], [address].    ZIO XT- Long Term Monitor Instructions   Your physician has requested you wear your ZIO patch monitor 7 days.   This is a single patch monitor.  [employer] supplies one patch monitor per enrollment.  Additional stickers are not available.   Please do not apply patch if you will be having a Nuclear Stress Test, Echocardiogram, Cardiac CT, MRI, or Chest Xray during the time frame you would be wearing the monitor. The patch cannot be worn during these tests.  You cannot remove and re-apply the ZIO XT patch monitor.   Your ZIO patch monitor will be sent USPS Priority mail from [facility] directly to your home address. The monitor may also be mailed to a PO BOX if home delivery is not available.   It may take 3-5 days to receive your monitor after you have been enrolled.   Once you have received you monitor, please review enclosed instructions.  Your monitor has already been registered assigning a specific monitor serial # to you.   Applying the monitor   Shave hair from upper left chest.   Hold abrader disc by orange  tab.  Rub abrader in 40 strokes over left upper chest as indicated in your monitor instructions.   Clean area with 4 enclosed alcohol pads .  Use all pads to assure are is cleaned thoroughly.  Let dry.   Apply patch as indicated in monitor instructions.  Patch will be place under collarbone on left side of chest with arrow pointing upward.   Rub patch adhesive wings for 2 minutes.Remove white label marked "1".  Remove white label marked "2".  Rub patch adhesive wings for 2 additional minutes.   While looking in a mirror, press and release button in center of patch.  A small green light will flash 3-4 times .  This will be your only indicator the monitor has been turned on.     Do not shower for the first 24 hours.  You may shower after the first 24 hours.   Press button if you feel a symptom. You will hear a small click.  Record Date, Time and Symptom in the Patient Log Book.   When you are ready to remove patch, follow instructions on last 2 pages of Patient Log Book.  Stick patch monitor onto last page of Patient Log Book.   Place Patient Log Book in Normal box.  Use locking tab on box and tape box closed securely.  The [employer] and [employer] has [employer] on it.  Please place in mailbox as soon as possible.  Your physician should have your test results approximately 7 days after the monitor has been mailed back to [facility].   Call [place] at [phone] if you have questions regarding your ZIO XT patch monitor.  Call them immediately if you see an orange light blinking on your monitor.   If your monitor falls off in less than 4 days contact our Monitor department at [phone].  If your monitor becomes loose or falls off after 4 days call [employer] at [phone] for suggestions on securing your monitor.   Follow-Up: At [facility], you and your health needs are our priority.  As part of our continuing mission to provide you with  exceptional heart care,  we have created designated Provider Care Teams.  These Care Teams include your primary Cardiologist (physician) and Advanced Practice Providers (APPs -  Physician Assistants and Nurse Practitioners) who all work together to provide you with the care you need, when you need it.  Your next appointment:   3 month(s)  The format for your next appointment:   In Person  Provider:   Oswaldo Milian, MD     Signed, Donato Heinz, MD  11/16/2020 2:29 PM    [place]

## 2020-11-13 ENCOUNTER — Telehealth: Payer: Self-pay | Admitting: Internal Medicine

## 2020-11-13 ENCOUNTER — Encounter: Payer: Self-pay | Admitting: Internal Medicine

## 2020-11-13 NOTE — Telephone Encounter (Signed)
Call placed to patient. Advised that he can take this medication without regards to meals. In addition, I recommend to take in the morning (same time) daily. Pt had no further questions and verbalized understanding.

## 2020-11-13 NOTE — Telephone Encounter (Signed)
Patient called to ask the nurse or doctor to call regarding how he should be taking his medication, sertraline (ZOLOFT) 50 MG tablet.  He is not clear on when and how to take the medication.  Please advise and call patient to discuss at [phone]

## 2020-11-14 ENCOUNTER — Ambulatory Visit: Payer: Self-pay | Attending: Internal Medicine

## 2020-11-14 ENCOUNTER — Encounter (HOSPITAL_BASED_OUTPATIENT_CLINIC_OR_DEPARTMENT_OTHER): Payer: Self-pay | Admitting: Emergency Medicine

## 2020-11-14 ENCOUNTER — Other Ambulatory Visit: Payer: Self-pay

## 2020-11-14 ENCOUNTER — Telehealth: Payer: Self-pay | Admitting: Internal Medicine

## 2020-11-14 ENCOUNTER — Emergency Department (HOSPITAL_BASED_OUTPATIENT_CLINIC_OR_DEPARTMENT_OTHER)
Admission: EM | Admit: 2020-11-14 | Discharge: 2020-11-15 | Disposition: A | Discharge status: Home / Self Care | Payer: Self-pay | Attending: Emergency Medicine | Admitting: Emergency Medicine

## 2020-11-14 ENCOUNTER — Telehealth: Payer: Self-pay

## 2020-11-14 DIAGNOSIS — E041 Nontoxic single thyroid nodule: Secondary | ICD-10-CM

## 2020-11-14 DIAGNOSIS — Z87891 Personal history of nicotine dependence: Secondary | ICD-10-CM | POA: Insufficient documentation

## 2020-11-14 DIAGNOSIS — R519 Headache, unspecified: Secondary | ICD-10-CM | POA: Insufficient documentation

## 2020-11-14 DIAGNOSIS — R002 Palpitations: Secondary | ICD-10-CM | POA: Insufficient documentation

## 2020-11-14 DIAGNOSIS — E010 Iodine-deficiency related diffuse (endemic) goiter: Secondary | ICD-10-CM

## 2020-11-14 DIAGNOSIS — R52 Pain, unspecified: Secondary | ICD-10-CM

## 2020-11-14 DIAGNOSIS — R718 Other abnormality of red blood cells: Secondary | ICD-10-CM | POA: Insufficient documentation

## 2020-11-14 NOTE — ED Triage Notes (Addendum)
Pt c/o elevated heart rate and sharp pains in body x several weeks. Pt states tonight was worse and more persistent. Pt denies caffeine or energy drinks. Pt took first dose of zoloft today.

## 2020-11-14 NOTE — Telephone Encounter (Signed)
Pt is coming by today to get blood work also pt has already been scheduled for biopsy on dec 9

## 2020-11-14 NOTE — Telephone Encounter (Signed)
Phone call placed to patient yesterday to go over results of barium swallow and thyroid ultrasound.  Patient informed that the barium swallow shows normal swallow function except there is slight deviation of the esophagus to the left.  I suspect this is from compression of a large thyroid nodule that is on the right gland and was seen on thyroid ultrasound.  He has 2 thyroid nodules one in the isthmus and one in the right thyroid gland.  Given the size of the 1 in the right side and symptoms that he is having, we recommend biopsy done through interventional radiology.  We need to rule out malignancy of the thyroid gland.  I informed him that I would like to have thyroid hormone levels checked before he has the thyroid biopsy done.  Patient is agreeable to this. I inquired whether he has started taking the Zoloft as yet, patient states that he did not start it as yet because he began to feel better for a few days after he saw me.  Symptoms have not completely gone away but have not been as severe as before.  Reports that his swallowing is also a bit better.  Reports his weight has remained stable since last visit with me earlier this month.  He plans to start Zoloft this week.  He will come to the lab this week to have the thyroid level checked.

## 2020-11-14 NOTE — Telephone Encounter (Signed)
Sent pt a Mychart message to see when he will be able to come get labs drawn before scheduling biopsy

## 2020-11-14 NOTE — Telephone Encounter (Signed)
Mychart

## 2020-11-15 ENCOUNTER — Other Ambulatory Visit: Payer: Self-pay | Admitting: Internal Medicine

## 2020-11-15 LAB — TSH+T4F+T3FREE
Free T4: 1.64 ng/dL (ref 0.82–1.77)
T3, Free: 3.2 pg/mL (ref 2.0–4.4)
TSH: 1.17 u[IU]/mL (ref 0.450–4.500)

## 2020-11-15 MED ORDER — METOPROLOL TARTRATE 25 MG PO TABS
25.0000 mg | ORAL_TABLET | Freq: Once | ORAL | Status: AC
  Administered 2020-11-15: 25 mg via ORAL
  Filled 2020-11-15: qty 1

## 2020-11-15 MED ORDER — METOPROLOL SUCCINATE ER 50 MG PO TB24: 50.0000 mg | ORAL_TABLET | Freq: Every day | ORAL | 0 refills | Status: DC

## 2020-11-15 NOTE — ED Provider Notes (Signed)
[facility] Provider Note: Marcus Spurling, MD, FACEP  CSN: [id number] MRN: [id number] ARRIVAL: 11/14/20 at 2336 ROOM: [place]  Palpitations   HISTORY OF PRESENT ILLNESS  11/15/20 3:05 AM Marcus Hernandez is a 34 y.o. male who is being worked up for thyroid disease.  He had thyroid studies drawn by his PCP yesterday and has a thyroid biopsy pending for November 23, 2020.   He is here with intermittent palpitations since yesterday.  By palpitations he means a sensation of the heart is racing.  The palpitations come and go without provocation.  Nothing makes them better or worse.  He denies chest pain or shortness of breath with this.  He has not had any heart racing since he has been in the ED on the monitor.  He has also had multifocal pain for about a month.  The pains come and go and are fleeting.  They occur "everywhere".  On presentation he was focused primarily on his mid back where he had pain that he rates as a 7 out of 10, worse with movement.  He now tells me his left elbow is hurting the worst.  He has not taken anything for his pain.  He has also had a headache.   Past Medical History:  Diagnosis Date  . Axillary abscess     History reviewed. No pertinent surgical history.  Family History  Problem Relation Age of Onset  . Hyperthyroidism Sister     Social History   Tobacco Use  . Smoking status: Former Smoker    Types: Cigars  . Smokeless tobacco: Never Used  Vaping Use  . Vaping Use: Never used  Substance Use Topics  . Alcohol use: Yes    Comment: weekly  . Drug use: No    Prior to Admission medications   Medication Sig Start Date End Date Taking? Authorizing Provider  sertraline (ZOLOFT) 50 MG tablet 1/2 tab daily x 3 wks then 1 tab PO daily 10/31/20  Yes Marcus Pier, MD  metoprolol succinate (TOPROL-XL) 50 MG 24 hr tablet Take 1 tablet (50 mg total) by mouth daily. 11/15/20   Marcus Albo, MD    Allergies Shrimp  [shellfish allergy]   REVIEW OF SYSTEMS  Negative except as noted here or in the History of Present Illness.   PHYSICAL EXAMINATION  Initial Vital Signs Blood pressure 130/70, pulse 82, temperature 98 F (36.7 C), resp. rate 14, height 5\' 6"  (1.676 m), weight 72.8 kg, SpO2 99 %.  Examination General: Well-developed, well-nourished male in no acute distress; appearance consistent with age of record HENT: normocephalic; atraumatic; thyromegaly without tenderness Eyes: pupils equal, round and reactive to light; extraocular muscles intact; no proptosis Neck: supple Heart: regular rate and rhythm Lungs: clear to auscultation bilaterally Abdomen: soft; nondistended; nontender; bowel sounds present Extremities: No deformity; full range of motion; pulses normal Neurologic: Awake, alert and oriented; motor function intact in all extremities and symmetric; no facial droop Skin: Warm and dry Psychiatric: Normal mood and affect   RESULTS  Summary of this visit's results, reviewed and interpreted by myself:   EKG Interpretation  Date/Time:  Tuesday November 14 2020 23:54:06 EST Ventricular Rate:  96 PR Interval:    QRS Duration: 101 QT Interval:  339 QTC Calculation: 429 R Axis:   91 Text Interpretation: Sinus rhythm Borderline right axis deviation No significant change was found Confirmed by Marcus Hernandez [phone]) on 11/15/2020 12:10:55 AM      Laboratory  Studies: No results found for this or any previous visit (from the past 24 hour(s)). Imaging Studies: No results found.  ED COURSE and MDM  Nursing notes, initial and subsequent vitals signs, including pulse oximetry, reviewed and interpreted by myself.  Vitals:   11/15/20 0008 11/15/20 0011 11/15/20 0130 11/15/20 0200  BP:   130/70 130/70  Pulse: 100 84 75 82  Resp: 19 14 13 14   Temp:      SpO2: 99% 98% 99% 99%  Weight:      Height:       Medications  metoprolol tartrate (LOPRESSOR) tablet 25 mg (25 mg Oral Given  11/15/20 0324)    3:16 AM The patient's rhythm strip for his time in the ED was reviewed.  He had no tachyarrhythmias or ectopy during this time.  The patient's enlarged thyroid and palpitations are suspicious for hyperthyroidism and we will trial metoprolol pending follow-up with his PCP, Dr. Wynetta Hernandez.  The cause of his fleeting pains is unclear but we will try an NSAID.  PROCEDURES  Procedures   ED DIAGNOSES     ICD-10-CM   1. Palpitations  R00.2   2. Diffuse pain  R52   3. Microcytosis  R71.8        Marcus Chancellor, MD 11/15/20 [phone]

## 2020-11-16 ENCOUNTER — Encounter: Payer: Self-pay | Admitting: Cardiology

## 2020-11-16 ENCOUNTER — Other Ambulatory Visit: Payer: Self-pay

## 2020-11-16 ENCOUNTER — Telehealth: Payer: Self-pay | Admitting: Radiology

## 2020-11-16 ENCOUNTER — Ambulatory Visit (INDEPENDENT_AMBULATORY_CARE_PROVIDER_SITE_OTHER): Payer: Self-pay | Admitting: Cardiology

## 2020-11-16 VITALS — BP 112/73 | HR 100 | Ht 66.5 in | Wt 155.6 lb

## 2020-11-16 DIAGNOSIS — R0600 Dyspnea, unspecified: Secondary | ICD-10-CM

## 2020-11-16 DIAGNOSIS — R079 Chest pain, unspecified: Secondary | ICD-10-CM

## 2020-11-16 DIAGNOSIS — R002 Palpitations: Secondary | ICD-10-CM

## 2020-11-16 NOTE — Telephone Encounter (Signed)
Enrolled patient for a 7 day Zio XT monitor to be mailed to patients home.  

## 2020-11-16 NOTE — Patient Instructions (Addendum)
Medication Instructions:  STOP METOPROLOL  *If you need a refill on your cardiac medications before your next appointment, please call your pharmacy*  Testing/Procedures: Your physician has requested that you have an echocardiogram. Echocardiography is a painless test that uses sound waves to create images of your heart. It provides your doctor with information about the size and shape of your heart and how well your heart's chambers and valves are working. You may receive an ultrasound enhancing agent through an IV if needed to better visualize your heart during the echo.This procedure takes approximately one hour. There are no restrictions for this procedure. This will take place at the [address].    ZIO XT- Long Term Monitor Instructions   Your physician has requested you wear your ZIO patch monitor 7 days.   This is a single patch monitor.  [employer] supplies one patch monitor per enrollment.  Additional stickers are not available.   Please do not apply patch if you will be having a Nuclear Stress Test, Echocardiogram, Cardiac CT, MRI, or Chest Xray during the time frame you would be wearing the monitor. The patch cannot be worn during these tests.  You cannot remove and re-apply the ZIO XT patch monitor.   Your ZIO patch monitor will be sent USPS Priority mail from [facility] directly to your home address. The monitor may also be mailed to a PO BOX if home delivery is not available.   It may take 3-5 days to receive your monitor after you have been enrolled.   Once you have received you monitor, please review enclosed instructions.  Your monitor has already been registered assigning a specific monitor serial # to you.   Applying the monitor   Shave hair from upper left chest.   Hold abrader disc by orange tab.  Rub abrader in 40 strokes over left upper chest as indicated in your monitor instructions.   Clean area with 4 enclosed alcohol pads .  Use all pads  to assure are is cleaned thoroughly.  Let dry.   Apply patch as indicated in monitor instructions.  Patch will be place under collarbone on left side of chest with arrow pointing upward.   Rub patch adhesive wings for 2 minutes.Remove white label marked "1".  Remove white label marked "2".  Rub patch adhesive wings for 2 additional minutes.   While looking in a mirror, press and release button in center of patch.  A small green light will flash 3-4 times .  This will be your only indicator the monitor has been turned on.     Do not shower for the first 24 hours.  You may shower after the first 24 hours.   Press button if you feel a symptom. You will hear a small click.  Record Date, Time and Symptom in the Patient Log Book.   When you are ready to remove patch, follow instructions on last 2 pages of Patient Log Book.  Stick patch monitor onto last page of Patient Log Book.   Place Patient Log Book in [place] box.  Use locking tab on box and tape box closed securely.  The [employer] has [employer] on it.  Please place in mailbox as soon as possible.  Your physician should have your test results approximately 7 days after the monitor has been mailed back to [facility].   Call [facility] at [phone] if you have questions regarding your ZIO XT patch  monitor.  Call them immediately if you see an orange light blinking on your monitor.   If your monitor falls off in less than 4 days contact our Monitor department at [phone].  If your monitor becomes loose or falls off after 4 days call [employer] at [phone] for suggestions on securing your monitor.   Follow-Up: At [facility], you and your health needs are our priority.  As part of our continuing mission to provide you with exceptional heart care, we have created designated Provider Care Teams.  These Care Teams include your primary Cardiologist (physician) and Advanced Practice Providers (APPs -   Physician Assistants and Nurse Practitioners) who all work together to provide you with the care you need, when you need it.  Your next appointment:   3 month(s)  The format for your next appointment:   In Person  Provider:   Oswaldo Milian, MD

## 2020-11-22 ENCOUNTER — Other Ambulatory Visit: Payer: Self-pay

## 2020-11-22 ENCOUNTER — Ambulatory Visit: Payer: Self-pay | Attending: Internal Medicine | Admitting: Licensed Clinical Social Worker

## 2020-11-22 DIAGNOSIS — F411 Generalized anxiety disorder: Secondary | ICD-10-CM

## 2020-11-23 ENCOUNTER — Other Ambulatory Visit (HOSPITAL_COMMUNITY)
Admission: RE | Admit: 2020-11-23 | Discharge: 2020-11-23 | Disposition: A | Discharge status: Home / Self Care | Payer: Self-pay | Source: Ambulatory Visit | Attending: Internal Medicine | Admitting: Internal Medicine

## 2020-11-23 ENCOUNTER — Ambulatory Visit
Admission: RE | Admit: 2020-11-23 | Discharge: 2020-11-23 | Disposition: A | Discharge status: Home / Self Care | Payer: Self-pay | Source: Ambulatory Visit | Attending: Internal Medicine | Admitting: Internal Medicine

## 2020-11-23 DIAGNOSIS — E041 Nontoxic single thyroid nodule: Secondary | ICD-10-CM

## 2020-11-23 DIAGNOSIS — D34 Benign neoplasm of thyroid gland: Secondary | ICD-10-CM | POA: Insufficient documentation

## 2020-11-23 NOTE — Progress Notes (Signed)
Cardiology Office Note:    Date:  11/24/2020   ID:  AHMIR BRACKEN, DOB [birth year], MRN [id number]  PCP:  Ladell Pier, MD  Cardiologist:  No primary care provider on file.  Electrophysiologist:  None   Referring MD: Ladell Pier, MD   Chief Complaint  Patient presents with  . Palpitations   History of Present Illness:    Marcus Hernandez is a 34 y.o. male with a hx of COVID-19 infection 07/2020 who presents for follow-up.  He was referred by Dr. Wynetta Emery for evaluation of chest pain, initially seen on 11/16/2020.  He reports that his symptoms started in October 2021.  Describes daily chest pain that occurs at different spots on the chest.  Sometimes can be left-sided, sometimes in the center, and sometimes right-sided pain.  Reports pain is either sharp or tingling sensation.  Typically just lasts for few seconds and resolves.  Sometimes the tingling sensation can last up to 20 minutes.  He does not note anything that causes it.  Does not exercise regularly but has not noted a relationship with exertion.  States that pain occurs at rest.  Also reports that he has been having shortness of breath.  Reports having lightheadedness but denies any syncope.  No lower extremity edema.  Also having palpitations that occur multiple times daily.  Palpitations started the last 2 to 3 weeks.  Occurs at rest.  States that it feels like his heart is racing during the episodes, will last for minutes.  Went to ED on 11/30 for the symptoms.  Troponins were negative.  Was prescribed metoprolol for his palpitations but has not started taking yet.  He smoked cigarettes for 2 to 3 years, 3 to 4 cigarettes/day.  Also smoked cigars for 2 to 3 years, quit in 2014.  No history of heart disease in his immediate family.  Recently found a thyroid nodules, biopsy is being planned.  Normal TSH/free T4 on 11/30.  A tinitial clinic visit, echocardiogram and Zio patch ordered, but neither has been done yet.  He  reports palpitations have worsened, feels like they are lasting all day.  Continues to have dyspnea and chest pain.    Past Medical History:  Diagnosis Date  . Axillary abscess     No past surgical history on file.  Current Medications: Current Meds  Medication Sig  . sertraline (ZOLOFT) 50 MG tablet 1/2 tab daily x 3 wks then 1 tab PO daily     Allergies:   Shrimp [shellfish allergy]   Social History   Socioeconomic History  . Marital status: Single    Spouse name: Not on file  . Number of children: 3  . Years of education: Not on file  . Highest education level: Not on file  Occupational History  . Occupation: [employer]  Tobacco Use  . Smoking status: Former Smoker    Types: Cigars  . Smokeless tobacco: Never Used  Vaping Use  . Vaping Use: Never used  Substance and Sexual Activity  . Alcohol use: Yes    Comment: weekly  . Drug use: No  . Sexual activity: Not on file  Other Topics Concern  . Not on file  Social History Narrative  . Not on file   Social Determinants of Health   Financial Resource Strain: Not on file  Food Insecurity: Not on file  Transportation Needs: Not on file  Physical Activity: Not on file  Stress: Not on file  Social Connections: Not  on file     Family History: No known history of heart disease in his immediate family  ROS:   Please see the history of present illness.     All other systems reviewed and are negative.  EKGs/Labs/Other Studies Reviewed:    The following studies were reviewed today:   EKG:  EKG is ordered today.  The ekg ordered today demonstrates normal sinus rhythm, rate 84, no ST abnromalities  Recent Labs: 10/20/2020: BUN 10; Creatinine, Ser 1.17; Hemoglobin 14.5; Platelets 329; Potassium 4.1; Sodium 136 11/14/2020: TSH 1.170  Recent Lipid Panel No results found for: CHOL, TRIG, HDL, CHOLHDL, VLDL, LDLCALC, LDLDIRECT  Physical Exam:    VS:  BP 108/76   Pulse 84   Ht 5\' 6"  (1.676 m)   Wt 152 lb  (68.9 kg)   SpO2 96%   BMI 24.53 kg/m     Wt Readings from Last 3 Encounters:  11/24/20 152 lb (68.9 kg)  11/16/20 155 lb 9.6 oz (70.6 kg)  11/14/20 160 lb 7.9 oz (72.8 kg)     GEN: Well nourished, well developed in no acute distress HEENT: Normal NECK: No JVD; No carotid bruits LYMPHATICS: No lymphadenopathy CARDIAC: RRR, no murmurs, rubs, gallops RESPIRATORY:  Clear to auscultation without rales, wheezing or rhonchi  ABDOMEN: Soft, non-tender, non-distended MUSCULOSKELETAL:  No edema; No deformity  SKIN: Warm and dry NEUROLOGIC:  Alert and oriented x 3 PSYCHIATRIC:  Normal affect   ASSESSMENT:    1. Palpitations   2. Chest pain of uncertain etiology   3. Dyspnea, unspecified type    PLAN:    Chest pain: Description suggests noncardiac chest pain, as describes sharp/tingling pain that typically last for few seconds, not related to exertion.  Given age and lack of risk factors, no further cardiac work-up recommended at this time.  Dyspnea: Given symptoms worsening since Covid infection in August, recommend echocardiogram to rule out myocardial involvement.  Echo scheduled for 12/27.  Palpitations: Description concerning for arrhythmia,  Zio patch x7 days was ordered at prior clinic visit but has not been done.  Zio patch applied in clinic today  RTC in 3 months  Medication Adjustments/Labs and Tests Ordered: Current medicines are reviewed at length with the patient today.  Concerns regarding medicines are outlined above.  Orders Placed This Encounter  Procedures  . EKG 12-Lead   No orders of the defined types were placed in this encounter.   Patient Instructions  Medication Instructions:  Your physician recommends that you continue on your current medications as directed. Please refer to the Current Medication list given to you today.   Testing/Procedures: Echo as scheduled Zio monitor as planned  Follow-Up: At [facility], you and your health needs are  our priority.  As part of our continuing mission to provide you with exceptional heart care, we have created designated Provider Care Teams.  These Care Teams include your primary Cardiologist (physician) and Advanced Practice Providers (APPs -  Physician Assistants and Nurse Practitioners) who all work together to provide you with the care you need, when you need it.  We recommend signing up for the patient portal called "MyChart".  Sign up information is provided on this After Visit Summary.  MyChart is used to connect with patients for Virtual Visits (Telemedicine).  Patients are able to view lab/test results, encounter notes, upcoming appointments, etc.  Non-urgent messages can be sent to your provider as well.   To learn more about what you can do with MyChart, go  to [email or web address].    Your next appointment:   As scheduled with Dr. Gardiner Rhyme 3/7       Signed, Donato Heinz, MD  11/24/2020 9:38 AM    [place]

## 2020-11-24 ENCOUNTER — Ambulatory Visit (INDEPENDENT_AMBULATORY_CARE_PROVIDER_SITE_OTHER): Payer: Self-pay

## 2020-11-24 ENCOUNTER — Other Ambulatory Visit: Payer: Self-pay

## 2020-11-24 ENCOUNTER — Ambulatory Visit (INDEPENDENT_AMBULATORY_CARE_PROVIDER_SITE_OTHER): Payer: Self-pay | Admitting: Cardiology

## 2020-11-24 ENCOUNTER — Encounter: Payer: Self-pay | Admitting: Cardiology

## 2020-11-24 VITALS — BP 108/76 | HR 84 | Ht 66.0 in | Wt 152.0 lb

## 2020-11-24 DIAGNOSIS — R0600 Dyspnea, unspecified: Secondary | ICD-10-CM

## 2020-11-24 DIAGNOSIS — R002 Palpitations: Secondary | ICD-10-CM

## 2020-11-24 DIAGNOSIS — R079 Chest pain, unspecified: Secondary | ICD-10-CM

## 2020-11-24 NOTE — Patient Instructions (Addendum)
Medication Instructions:  Your physician recommends that you continue on your current medications as directed. Please refer to the Current Medication list given to you today.   Testing/Procedures: Echo as scheduled Zio monitor as planned  Follow-Up: At [facility], you and your health needs are our priority.  As part of our continuing mission to provide you with exceptional heart care, we have created designated Provider Care Teams.  These Care Teams include your primary Cardiologist (physician) and Advanced Practice Providers (APPs -  Physician Assistants and Nurse Practitioners) who all work together to provide you with the care you need, when you need it.  We recommend signing up for the patient portal called "MyChart".  Sign up information is provided on this After Visit Summary.  MyChart is used to connect with patients for Virtual Visits (Telemedicine).  Patients are able to view lab/test results, encounter notes, upcoming appointments, etc.  Non-urgent messages can be sent to your provider as well.   To learn more about what you can do with MyChart, go to [email or web address].    Your next appointment:   As scheduled with Dr. Gardiner Rhyme 3/7

## 2020-11-25 LAB — CYTOLOGY - NON PAP

## 2020-12-01 ENCOUNTER — Encounter: Payer: Self-pay | Admitting: Internal Medicine

## 2020-12-02 ENCOUNTER — Other Ambulatory Visit: Payer: Self-pay | Admitting: Internal Medicine

## 2020-12-02 DIAGNOSIS — E010 Iodine-deficiency related diffuse (endemic) goiter: Secondary | ICD-10-CM

## 2020-12-02 DIAGNOSIS — E041 Nontoxic single thyroid nodule: Secondary | ICD-10-CM

## 2020-12-02 MED ORDER — BUSPIRONE HCL 5 MG PO TABS: 5.0000 mg | ORAL_TABLET | Freq: Two times a day (BID) | ORAL | 1 refills | Status: DC

## 2020-12-04 MED FILL — busPIRone HCL 5 MG TABS: 5 | 30 days supply | Qty: 60 | Fill #0

## 2020-12-11 ENCOUNTER — Ambulatory Visit (HOSPITAL_COMMUNITY): Payer: Self-pay | Attending: Cardiology

## 2020-12-11 ENCOUNTER — Other Ambulatory Visit: Payer: Self-pay

## 2020-12-11 DIAGNOSIS — R0600 Dyspnea, unspecified: Secondary | ICD-10-CM | POA: Insufficient documentation

## 2020-12-11 DIAGNOSIS — R002 Palpitations: Secondary | ICD-10-CM | POA: Insufficient documentation

## 2020-12-11 LAB — ECHOCARDIOGRAM COMPLETE
Area-P 1/2: 2.54 cm2
Calc EF: 48.1 %
S' Lateral: 3.8 cm
Single Plane A2C EF: 48.5 %
Single Plane A4C EF: 50.3 %

## 2020-12-12 NOTE — BH Specialist Note (Addendum)
Integrated Behavioral Health Initial In-Person Visit  MRN: [id number] Name: Marcus Hernandez  Number of Integrated Behavioral Health Clinician visits:: 1/6 Session Start time: 2:15 PM  Session End time: 2:50 PM Total time: 35  minutes  Types of Service: Individual psychotherapy  Interpretor:No. Interpretor Name and Language: NA  Subjective: Marcus Hernandez is a 34 y.o. male accompanied by Friend Patient was referred by Dr. Laural Benes for anxiety. Patient reports the following symptoms/concerns: Pt reports "slight" decrease in anxiety symptoms, including chest pains and heart palpitations. Pt reports triggers as grief of uncle, pain, and stress regarding health after testing positive for COVID19 in August 2021 Duration of problem: 3 months; Severity of problem: moderate  Objective: Mood: Anxious and Affect: Appropriate Risk of harm to self or others: No plan to harm self or others  Life Context: Family and Social: Pt receives support from family (mother and sister) and friends School/Work: Pt is uninsured, he has applied for [employer] Self-Care: Pt has not started zoloft prescribed by PCP Life Changes: Pt is concerned about health conditions   Patient and/or Family's Strengths/Protective Factors: Social connections, Social and Emotional competence, Concrete supports in place (healthy food, safe environments, etc.) and Sense of purpose  Goals Addressed: Patient will: 1. Increase knowledge and/or ability of: coping skills Pt agreed to continue listening to pod-casts and utilize grounding strategies discussed in session to assist with management of symptoms   Progress towards Goals: Ongoing  Interventions: Interventions utilized: Solution-Focused Strategies, Supportive Counseling and Psychoeducation and/or Health Education  Standardized Assessments completed: GAD-7 and PHQ 2&9  Patient Response: Pt was engaged in session and was open to grounding strategies to assist with management  of symptoms.   Patient Centered Plan: Patient is on the following Treatment Plan(s):  Anxiety  Assessment: Patient currently experiencing difficulty managing anxiety symptoms.   Patient may benefit from medication management and therapy. He is not interested in medications that requires daily intake. Pt would like an PRN medication to assist with anxiety symptoms, in addition, to a referral to a Nutritionist.  Plan: 1. Follow up with behavioral health clinician on : Pt will follow up with LCSW after scheduled appt with PCP on 12/28/20 2. Behavioral recommendations: Utilize strategies discused 3. Referral(s): Integrated Behavioral Health Services (In Clinic) 4. "From scale of 1-10, how likely are you to follow plan?":   Bridgett Larsson, LCSW  12/12/2020 4:30 PM

## 2020-12-13 ENCOUNTER — Telehealth: Payer: Self-pay | Admitting: Family Medicine

## 2020-12-13 DIAGNOSIS — R634 Abnormal weight loss: Secondary | ICD-10-CM

## 2020-12-13 MED ORDER — HYDROXYZINE HCL 25 MG PO TABS: 25.0000 mg | ORAL_TABLET | Freq: Three times a day (TID) | ORAL | 1 refills | Status: DC | PRN

## 2020-12-13 NOTE — Telephone Encounter (Signed)
-----   Message from Bridgett Larsson, LCSW sent at 12/12/2020  4:34 PM EST ----- Pt was prescribed Zoloft at last visit with you; however, reports that he is not interested in medications that requires daily intake. Pt would like an PRN medication to assist with anxiety symptoms, in addition, to a referral to a Nutritionist to address weight loss.

## 2020-12-13 NOTE — Telephone Encounter (Signed)
Call placed to patient to to provide update. Pt was appreciative for the follow up call. No additional concerns noted.

## 2020-12-13 NOTE — Telephone Encounter (Signed)
Dr. Laural Benes is out of the office.  Prescription for hydroxyzine sent to his pharmacy and nutrition referral placed.

## 2020-12-14 ENCOUNTER — Encounter: Payer: Self-pay | Admitting: Cardiology

## 2020-12-14 ENCOUNTER — Other Ambulatory Visit: Payer: Self-pay

## 2020-12-14 ENCOUNTER — Ambulatory Visit (INDEPENDENT_AMBULATORY_CARE_PROVIDER_SITE_OTHER): Payer: Self-pay | Admitting: Cardiology

## 2020-12-14 ENCOUNTER — Telehealth: Payer: Self-pay | Admitting: Cardiology

## 2020-12-14 VITALS — BP 98/71 | HR 72 | Ht 67.0 in | Wt 149.2 lb

## 2020-12-14 DIAGNOSIS — I5041 Acute combined systolic (congestive) and diastolic (congestive) heart failure: Secondary | ICD-10-CM

## 2020-12-14 DIAGNOSIS — R079 Chest pain, unspecified: Secondary | ICD-10-CM

## 2020-12-14 MED ORDER — METOPROLOL TARTRATE 50 MG PO TABS: ORAL_TABLET | ORAL | 0 refills | Status: DC

## 2020-12-14 MED ORDER — METOPROLOL SUCCINATE ER 25 MG PO TB24: 25.0000 mg | ORAL_TABLET | Freq: Every day | ORAL | 3 refills | Status: DC

## 2020-12-14 NOTE — Telephone Encounter (Signed)
Spoke with patient. Patient has a PRN metoprolol prescription from the ER he received in November for 50mg  daily as needed. He hasn't taken it the last 2 days but would like to know if he can resume the medication.   He reports he was advised not to take it while wearing his monitor. It is not on his current medication list.   He reports his palpitations are all day every day. If they're not controlled in the morning they make him feel awful all day and contribute to chest pain.   Will route to Dr. for review. Patient has an appointment for today.

## 2020-12-14 NOTE — Progress Notes (Signed)
Cardiology Office Note:    Date:  12/14/2020   ID:  Marcus Hernandez, DOB 06-10-1986, MRN [id number]  PCP:  Marcus Pier, MD  Cardiologist:  No primary care provider on file.  Electrophysiologist:  None   Referring MD: Marcus Pier, MD   Chief Complaint  Patient presents with  . Follow-up    FOLLOW-UP CHEST PAINS   History of Present Illness:    Marcus Hernandez is a 34 y.o. male with a hx of COVID-19 infection 07/2020 who presents for follow-up.  He was referred by Dr. Wynetta Hernandez for evaluation of chest pain, initially seen on 11/16/2020.  He reports that his symptoms started in October 2021.  Describes daily chest pain that occurs at different spots on the chest.  Sometimes can be left-sided, sometimes in the center, and sometimes right-sided pain.  Reports pain is either sharp or tingling sensation.  Typically just lasts for few seconds and resolves.  Sometimes the tingling sensation can last up to 20 minutes.  He does not note anything that causes it.  Does not exercise regularly but has not noted a relationship with exertion.  States that pain occurs at rest.  Also reports that he has been having shortness of breath.  Reports having lightheadedness but denies any syncope.  No lower extremity edema.  Also having palpitations that occur multiple times daily.  Palpitations started the last 2 to 3 weeks.  Occurs at rest.  States that it feels like his heart is racing during the episodes, will last for minutes.  Went to ED on 11/30 for the symptoms.  Troponins were negative.  Was prescribed metoprolol for his palpitations but has not started taking yet.  He smoked cigarettes for 2 to 3 years, 3 to 4 cigarettes/day.  Also smoked cigars for 2 to 3 years, quit in 2014.  No history of heart disease in his immediate family.  Recently found a thyroid nodules, biopsy is being planned.  Normal TSH/free T4 on 11/30.  Echocardiogram on 12/11/2020 showed LVEF 40 to 45%, global hypokinesis more  pronounced in apical segment, normal RV function, no significant valvular disease.  Zio patch x7 days showed no abnormalities.  Since last clinic visit, he reports that he has continued to have chest pain, occurs every day.  Also continues have shortness of breath.  No change in palpitations.   BP Readings from Last 3 Encounters:  12/14/20 98/71  11/24/20 108/76  11/16/20 112/73     Past Medical History:  Diagnosis Date  . Axillary abscess     No past surgical history on file.  Current Medications: Current Meds  Medication Sig  . hydrOXYzine (ATARAX/VISTARIL) 25 MG tablet Take 1 tablet (25 mg total) by mouth 3 (three) times daily as needed.  . metoprolol succinate (TOPROL XL) 25 MG 24 hr tablet Take 1 tablet (25 mg total) by mouth daily.  . metoprolol tartrate (LOPRESSOR) 50 MG tablet Take 50 mg (1 tablet) TWO hours prior to CT     Allergies:   Shrimp [shellfish allergy]   Social History   Socioeconomic History  . Marital status: Single    Spouse name: Not on file  . Number of children: 3  . Years of education: Not on file  . Highest education level: Not on file  Occupational History  . Occupation: [employer]  Tobacco Use  . Smoking status: Former Smoker    Types: Cigars  . Smokeless tobacco: Never Used  Vaping Use  . Vaping  Use: Never used  Substance and Sexual Activity  . Alcohol use: Yes    Comment: weekly  . Drug use: No  . Sexual activity: Not on file  Other Topics Concern  . Not on file  Social History Narrative  . Not on file   Social Determinants of Health   Financial Resource Strain: Not on file  Food Insecurity: Not on file  Transportation Needs: Not on file  Physical Activity: Not on file  Stress: Not on file  Social Connections: Not on file     Family History: No known history of heart disease in his immediate family  ROS:   Please see the history of present illness.     All other systems reviewed and are negative.  EKGs/Labs/Other  Studies Reviewed:    The following studies were reviewed today:   EKG:  EKG is ordered today.  The ekg ordered today demonstrates normal sinus rhythm, rate 72, no ST abnormalities  Recent Labs: 10/20/2020: BUN 10; Creatinine, Ser 1.17; Hemoglobin 14.5; Platelets 329; Potassium 4.1; Sodium 136 11/14/2020: TSH 1.170  Recent Lipid Panel No results found for: CHOL, TRIG, HDL, CHOLHDL, VLDL, LDLCALC, LDLDIRECT  Physical Exam:    VS:  BP 98/71 (BP Location: Left Arm, Patient Position: Sitting)   Pulse 72   Ht _0  (1.702 m)   Wt 149 lb 3.2 oz (67.7 kg)   SpO2 97%   BMI 23.37 kg/m     Wt Readings from Last 3 Encounters:  12/14/20 149 lb 3.2 oz (67.7 kg)  11/24/20 152 lb (68.9 kg)  11/16/20 155 lb 9.6 oz (70.6 kg)     GEN: Well nourished, well developed in no acute distress HEENT: Normal NECK: No JVD; No carotid bruits LYMPHATICS: No lymphadenopathy CARDIAC: RRR, no murmurs, rubs, gallops RESPIRATORY:  Clear to auscultation without rales, wheezing or rhonchi  ABDOMEN: Soft, non-tender, non-distended MUSCULOSKELETAL:  No edema; No deformity  SKIN: Warm and dry NEUROLOGIC:  Alert and oriented x 3 PSYCHIATRIC:  Normal affect   ASSESSMENT:    1. Chest pain of uncertain etiology   2. 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  Echocardiogram on 12/11/2020 showed LVEF 40 to 45%, global hypokinesis more pronounced in apical segments, normal RV function, no significant valvular disease.  Suspect myocarditis due to recent COVID-19 infection -Cardiac MRI to evaluate for myocarditis -Low suspicion for CAD given age/lack of risk factors, but given his persistent chest pain, will check coronary CTA to rule out CAD -Start Toprol-XL 25 mg daily.  BP soft, asked patient to monitor BP daily for next week.  If renal monitor BP, will add low-dose  losartan  Palpitations: Zio patch x7 days showed no abnormalities.  RTC in 1 month  Medication Adjustments/Labs and Tests Ordered: Current medicines are reviewed at length with the patient today.  Concerns regarding medicines are outlined above.  Orders Placed This Encounter  Procedures  . MR CARDIAC MORPHOLOGY W WO CONTRAST  . CT CORONARY MORPH W/CTA COR W/SCORE W/CA W/CM &/OR WO/CM  . CT CORONARY FRACTIONAL FLOW RESERVE DATA PREP  . CT CORONARY FRACTIONAL FLOW RESERVE FLUID ANALYSIS  . EKG 12-Lead   Meds ordered this encounter  Medications  . metoprolol tartrate (LOPRESSOR) 50 MG tablet    Sig: Take 50 mg (1 tablet) TWO hours prior to CT    Dispense:  1 tablet  Refill:  0  . metoprolol succinate (TOPROL XL) 25 MG 24 hr tablet    Sig: Take 1 tablet (25 mg total) by mouth daily.    Dispense:  90 tablet    Refill:  3    Patient Instructions  Medication Instructions:  START metoprolol succinate (Toprol XL) 25 mg daily  *If you need a refill on your cardiac medications before your next appointment, please call your pharmacy*  Testing/Procedures: Your physician has requested that you have a cardiac MRI. Cardiac MRI uses a computer to create images of your heart as its beating, producing both still and moving pictures of your heart and major blood vessels. For further information please visit [email or web address] Please follow the instruction sheet given to you today for more information.  Coronary CTA-see instructions below  Follow-Up: At [facility], you and your health needs are our priority.  As part of our continuing mission to provide you with exceptional heart care, we have created designated Provider Care Teams.  These Care Teams include your primary Cardiologist (physician) and Advanced Practice Providers (APPs -  Physician Assistants and Nurse Practitioners) who all work together to provide you with the care you need, when you need it.  We recommend signing up for  the patient portal called "MyChart".  Sign up information is provided on this After Visit Summary.  MyChart is used to connect with patients for Virtual Visits (Telemedicine).  Patients are able to view lab/test results, encounter notes, upcoming appointments, etc.  Non-urgent messages can be sent to your provider as well.   To learn more about what you can do with MyChart, go to [email or web address].    Your next appointment:   1 month(s)  The format for your next appointment:   In Person  Provider:   Oswaldo Milian, MD   Other Instructions Please check your blood pressure at home daily, write it down.  Call the office or send message via Mychart with the readings in 1 week for Dr. Gardiner Rhyme to review.       Coronary CTA instructions: Your cardiac CT will be scheduled at one of the below locations:   [facility] [address] [place], [place] [address] [phone]  [facility] [address] [place], [place] [address] [phone]  If scheduled at [facility], please arrive at the [facility] main entrance of [facility] 30 minutes prior to test start time. Proceed to the [facility] Radiology Department (first floor) to check-in and test prep.  If scheduled at [facility], please arrive 15 mins early for check-in and test prep.  Please follow these instructions carefully (unless otherwise directed):  On the Night Before the Test: . Be sure to Drink plenty of water. . Do not consume any caffeinated/decaffeinated beverages or chocolate 12 hours prior to your test. . Do not take any antihistamines 12 hours prior to your test. .  On the Day of the Test: . Drink plenty of water. Do not drink any water within one hour of the test. . Do not eat any food 4 hours prior to the test. . You may take your regular medications prior to the test.  . Take metoprolol  (Lopressor) two hours prior to test.      After the Test: . Drink plenty of water. . After receiving IV contrast, you may experience a mild flushed feeling. This is normal. . On occasion, you may experience a  mild rash up to 24 hours after the test. This is not dangerous. If this occurs, you can take Benadryl 25 mg and increase your fluid intake. . If you experience trouble breathing, this can be serious. If it is severe call 911 IMMEDIATELY. If it is mild, please call our office.  Once we have confirmed authorization from your insurance company, we will call you to set up a date and time for your test. Based on how quickly your insurance processes prior authorizations requests, please allow up to 4 weeks to be contacted for scheduling your Cardiac CT appointment. Be advised that routine Cardiac CT appointments could be scheduled as many as 8 weeks after your provider has ordered it.  For non-scheduling related questions, please contact the cardiac imaging nurse navigator should you have any questions/concerns: Marchia Bond, Cardiac Imaging Nurse Navigator Burley Saver, Interim Cardiac Imaging Nurse [place] and Vascular Services Direct Office Dial: [phone]   For scheduling needs, including cancellations and rescheduling, please call [place], [phone].       Signed, Donato Heinz, MD  12/14/2020 5:34 PM    [place]

## 2020-12-14 NOTE — Telephone Encounter (Signed)
Pt c/o medication issue:  1. Name of Medication: Metoprolol Succinate 50mg   2. How are you currently taking this medication (dosage and times per day)? Per patient, as needed but has not taken the past 2 days.   3. Are you having a reaction (difficulty breathing--STAT)? no  4. What is your medication issue? Patient wants to know if he should continue to take this medication as needed. He states he has not taken it the past two days due to his heart condition.

## 2020-12-14 NOTE — Patient Instructions (Signed)
Medication Instructions:  START metoprolol succinate (Toprol XL) 25 mg daily  *If you need a refill on your cardiac medications before your next appointment, please call your pharmacy*  Testing/Procedures: Your physician has requested that you have a cardiac MRI. Cardiac MRI uses a computer to create images of your heart as its beating, producing both still and moving pictures of your heart and major blood vessels. For further information please visit [email or web address] Please follow the instruction sheet given to you today for more information.  Coronary CTA-see instructions below  Follow-Up: At [facility], you and your health needs are our priority.  As part of our continuing mission to provide you with exceptional heart care, we have created designated Provider Care Teams.  These Care Teams include your primary Cardiologist (physician) and Advanced Practice Providers (APPs -  Physician Assistants and Nurse Practitioners) who all work together to provide you with the care you need, when you need it.  We recommend signing up for the patient portal called "MyChart".  Sign up information is provided on this After Visit Summary.  MyChart is used to connect with patients for Virtual Visits (Telemedicine).  Patients are able to view lab/test results, encounter notes, upcoming appointments, etc.  Non-urgent messages can be sent to your provider as well.   To learn more about what you can do with MyChart, go to [email or web address].    Your next appointment:   1 month(s)  The format for your next appointment:   In Person  Provider:   Oswaldo Milian, MD   Other Instructions Please check your blood pressure at home daily, write it down.  Call the office or send message via Mychart with the readings in 1 week for Dr. Gardiner Rhyme to review.       Coronary CTA instructions: Your cardiac CT will be scheduled at one of the below locations:   [facility] [address] [place], [place] [address] [phone]  [facility] [address] [place], [place] [address] [phone]  If scheduled at [facility], please arrive at the [facility] main entrance of [facility] 30 minutes prior to test start time. Proceed to the [facility] Radiology Department (first floor) to check-in and test prep.  If scheduled at [facility], please arrive 15 mins early for check-in and test prep.  Please follow these instructions carefully (unless otherwise directed):  On the Night Before the Test:  Be sure to Drink plenty of water.  Do not consume any caffeinated/decaffeinated beverages or chocolate 12 hours prior to your test.  Do not take any antihistamines 12 hours prior to your test.   On the Day of the Test:  Drink plenty of water. Do not drink any water within one hour of the test.  Do not eat any food 4 hours prior to the test.  You may take your regular medications prior to the test.   Take metoprolol (Lopressor) two hours prior to test.      After the Test:  Drink plenty of water.  After receiving IV contrast, you may experience a mild flushed feeling. This is normal.  On occasion, you may experience a mild rash up to 24 hours after the test. This is not dangerous. If this occurs, you can take Benadryl 25 mg and increase your fluid intake.  If you experience trouble breathing, this can be serious. If it is severe call 911 IMMEDIATELY. If it is  mild, please call our office.  Once we have confirmed authorization from your insurance company, we will call you to set up a date and time for your test. Based on how quickly your insurance processes prior authorizations requests, please allow up to 4 weeks to be contacted for scheduling your Cardiac CT appointment. Be advised that routine Cardiac CT appointments could be scheduled as many as 8 weeks after your  provider has ordered it.  For non-scheduling related questions, please contact the cardiac imaging nurse navigator should you have any questions/concerns: Marchia Bond, Cardiac Imaging Nurse Navigator Burley Saver, Interim Cardiac Imaging Nurse [place] and Vascular Services Direct Office Dial: [phone]   For scheduling needs, including cancellations and rescheduling, please call [place], [phone].

## 2020-12-14 NOTE — Telephone Encounter (Signed)
Has appointment today, will discuss

## 2020-12-16 ENCOUNTER — Emergency Department (HOSPITAL_COMMUNITY)
Admission: EM | Admit: 2020-12-16 | Discharge: 2020-12-16 | Disposition: A | Discharge status: Home / Self Care | Payer: 59 | Attending: Emergency Medicine | Admitting: Emergency Medicine

## 2020-12-16 ENCOUNTER — Encounter (HOSPITAL_COMMUNITY): Payer: Self-pay | Admitting: *Deleted

## 2020-12-16 ENCOUNTER — Other Ambulatory Visit: Payer: Self-pay

## 2020-12-16 ENCOUNTER — Emergency Department (HOSPITAL_COMMUNITY): Payer: 59

## 2020-12-16 DIAGNOSIS — Z87891 Personal history of nicotine dependence: Secondary | ICD-10-CM | POA: Insufficient documentation

## 2020-12-16 DIAGNOSIS — R0789 Other chest pain: Secondary | ICD-10-CM | POA: Insufficient documentation

## 2020-12-16 DIAGNOSIS — R002 Palpitations: Secondary | ICD-10-CM | POA: Insufficient documentation

## 2020-12-16 DIAGNOSIS — R079 Chest pain, unspecified: Secondary | ICD-10-CM

## 2020-12-16 LAB — BASIC METABOLIC PANEL
Anion gap: 9 (ref 5–15)
BUN: 10 mg/dL (ref 6–20)
CO2: 25 mmol/L (ref 22–32)
Calcium: 9.5 mg/dL (ref 8.9–10.3)
Chloride: 104 mmol/L (ref 98–111)
Creatinine, Ser: 1.01 mg/dL (ref 0.61–1.24)
GFR, Estimated: >60 mL/min (ref >60)
Glucose, Bld: 105 mg/dL — ABNORMAL HIGH (ref 70–99)
Potassium: 3.5 mmol/L (ref 3.5–5.1)
Sodium: 138 mmol/L (ref 135–145)

## 2020-12-16 LAB — CBC
HCT: 43.9 % (ref 39.0–52.0)
Hemoglobin: 13.7 g/dL (ref 13.0–17.0)
MCH: 23 pg — ABNORMAL LOW (ref 26.0–34.0)
MCHC: 31.2 g/dL (ref 30.0–36.0)
MCV: 73.7 fL — ABNORMAL LOW (ref 80.0–100.0)
Platelets: 324 10*3/uL (ref 150–400)
RBC: 5.96 MIL/uL — ABNORMAL HIGH (ref 4.22–5.81)
RDW: 15.3 % (ref 11.5–15.5)
WBC: 9.2 10*3/uL (ref 4.0–10.5)
nRBC: 0 % (ref 0.0–0.2)

## 2020-12-16 LAB — TROPONIN I (HIGH SENSITIVITY): Troponin I (High Sensitivity): <2 ng/L (ref <18)

## 2020-12-16 NOTE — Discharge Instructions (Addendum)
Please call your cardiology office on Monday to get close follow-up appointment.  Continue with plan for outpatient imaging of your heart.  If you have worsening chest pain, difficulty in breathing, or other new concerning symptom, return to ER for reassessment.

## 2020-12-16 NOTE — ED Triage Notes (Signed)
Pt complains of chest pain, shortness of breath, heart palpitations. Chest pain started 1 hour prior to arrival. He had echocardiogram on 12/27 showing EF 40%. Cardiologist wants him to have cardiac CT.

## 2020-12-18 ENCOUNTER — Telehealth: Payer: Self-pay | Admitting: Cardiology

## 2020-12-18 NOTE — Telephone Encounter (Signed)
Spoke with patient regarding preferred weekdays and times for the Cardiac MRI ordered by Dr. Bjorn Pippin.  Informed patient as soon as we hear from the insurance company regarding the prior authorization --I will be in touch with his appointment information.  He voiced his understanding.

## 2020-12-18 NOTE — Addendum Note (Signed)
Addended by: Epifanio Lesches on: 12/18/2020 12:33 PM   Modules accepted: Orders

## 2020-12-20 ENCOUNTER — Ambulatory Visit: Payer: Self-pay | Admitting: Cardiology

## 2020-12-21 ENCOUNTER — Encounter: Payer: Self-pay | Admitting: Cardiology

## 2020-12-21 NOTE — Telephone Encounter (Signed)
Spoke with patient regarding Cardiac MRI appointment scheduled Wednesday 01/17/21 at 11:00 am at [facility]---arrival time is 10:30 am---1st floor admissions office.  Will mail information to patient and he voiced his understanding.

## 2020-12-22 NOTE — ED Provider Notes (Signed)
[facility] DEPT Provider Note   CSN: [id number] Arrival date & time: 12/16/20  1527     History Chief Complaint  Patient presents with  . Chest Pain    Marcus Hernandez is a 35 y.o. male.  Presented to ER after having an episode of chest pain and palpitations.  Episode occurred earlier today.  Relatively brief, occurring for a few minutes, described as a general discomfort across chest and sensation of heart racing.  Nonradiating.  Not currently having any ongoing symptoms.  Not associated with any shortness of breath.  Feels similar to past episodes that he has been having over the past few months.  Reports that he has been evaluated by outpatient cardiology, abnormal echo and is supposed to have additional testing.  Reviewed chart, cardiology note from 12/30 describing similar complaints as patient is having today.  Outpatient echocardiogram demonstrating LVEF 40 to 45% with global hypokinesis.  Cardiologist suspected myocarditis due to recent COVID-19 infection.  Recommended obtaining cardiac MRI to evaluate for myocarditis and coronary CTA to rule out CAD as etiology for his heart failure.  Recently had Zio patch which was normal.  HPI     Past Medical History:  Diagnosis Date  . Axillary abscess     Patient Active Problem List   Diagnosis Date Noted  . Thyromegaly 11/01/2020  . Weight loss 11/01/2020  . Pharyngoesophageal dysphagia 11/01/2020  . SOB (shortness of breath) 11/01/2020  . Chest pain in adult 11/01/2020  . Influenza vaccine refused 10/31/2020    History reviewed. No pertinent surgical history.     Family History  Problem Relation Age of Onset  . Hyperthyroidism Sister     Social History   Tobacco Use  . Smoking status: Former Smoker    Types: Cigars  . Smokeless tobacco: Never Used  Vaping Use  . Vaping Use: Never used  Substance Use Topics  . Alcohol use: Yes    Comment: weekly  . Drug use: No    Home  Medications Prior to Admission medications   Medication Sig Start Date End Date Taking? Authorizing Provider  busPIRone (BUSPAR) 5 MG tablet Take 1 tablet (5 mg total) by mouth 2 (two) times daily. 12/02/20   Ladell Pier, MD  hydrOXYzine (ATARAX/VISTARIL) 25 MG tablet Take 1 tablet (25 mg total) by mouth 3 (three) times daily as needed. 12/13/20   Charlott Rakes, MD  metoprolol succinate (TOPROL XL) 25 MG 24 hr tablet Take 1 tablet (25 mg total) by mouth daily. 12/14/20   Donato Heinz, MD  metoprolol tartrate (LOPRESSOR) 50 MG tablet Take 50 mg (1 tablet) TWO hours prior to CT 12/14/20   Donato Heinz, MD    Allergies    Shrimp [shellfish allergy]  Review of Systems   Review of Systems  Constitutional: Negative for chills and fever.  HENT: Negative for ear pain and sore throat.   Eyes: Negative for pain and visual disturbance.  Respiratory: Positive for chest tightness. Negative for cough and shortness of breath.   Cardiovascular: Positive for chest pain and palpitations.  Gastrointestinal: Negative for abdominal pain and vomiting.  Genitourinary: Negative for dysuria and hematuria.  Musculoskeletal: Negative for arthralgias and back pain.  Skin: Negative for color change and rash.  Neurological: Negative for seizures and syncope.  All other systems reviewed and are negative.   Physical Exam Updated Vital Signs BP 122/83 (BP Location: Left Arm)   Pulse 81   Temp 98.9 F (37.2 C) (  Oral)   Resp 16   SpO2 99%   Physical Exam Vitals and nursing note reviewed.  Constitutional:      Appearance: He is well-developed and well-nourished.  HENT:     Head: Normocephalic and atraumatic.  Eyes:     Conjunctiva/sclera: Conjunctivae normal.  Cardiovascular:     Rate and Rhythm: Normal rate and regular rhythm.     Heart sounds: Normal heart sounds. No murmur heard. No friction rub. No gallop.   Pulmonary:     Effort: Pulmonary effort is normal. No  respiratory distress.     Breath sounds: Normal breath sounds.  Abdominal:     Palpations: Abdomen is soft.     Tenderness: There is no abdominal tenderness.  Musculoskeletal:        General: No edema.     Cervical back: Neck supple.  Skin:    General: Skin is warm and dry.     Capillary Refill: Capillary refill takes less than 2 seconds.  Neurological:     Mental Status: He is alert.  Psychiatric:        Mood and Affect: Mood and affect normal.     ED Results / Procedures / Treatments   Labs (all labs ordered are listed, but only abnormal results are displayed) Labs Reviewed  BASIC METABOLIC PANEL - Abnormal; Notable for the following components:      Result Value   Glucose, Bld 105 (*)    All other components within normal limits  CBC - Abnormal; Notable for the following components:   RBC 5.96 (*)    MCV 73.7 (*)    MCH 23.0 (*)    All other components within normal limits  TROPONIN I (HIGH SENSITIVITY)    EKG EKG Interpretation  Date/Time:  Saturday December 16 2020 15:41:51 EST Ventricular Rate:  93 PR Interval:  150 QRS Duration: 96 QT Interval:  336 QTC Calculation: 417 R Axis:   92 Text Interpretation: Normal sinus rhythm Rightward axis Borderline ECG Confirmed by Madalyn Rob [phone]) on 12/16/2020 7:45:42 PM   Radiology No results found.  Procedures Procedures (including critical care time)  Medications Ordered in ED Medications - No data to display  ED Course  I have reviewed the triage vital signs and the nursing notes.  Pertinent labs & imaging results that were available during my care of the patient were reviewed by me and considered in my medical decision making (see chart for details).    MDM Rules/Calculators/A&P                         35 year old male presents to ER with concern for recurrent episodes of chest discomfort and palpitations.  On my assessment in the emergency room he is symptom-free with normal vital signs.  His EKG has  no acute ischemic change and his troponin is undetectable.  Based on description of symptoms and these findings, I have a very low suspicion for ACS. Currently being worked up for heart failure in the outpatient setting.  Planning cardiac MRI and coronary CTA.  Reviewed case in detail with Dr. Vickki Muff on-call for Cardiology group.  Given current presentation today and reassuring work-up and exam, he recommends continuing with the outpatient plan and discharge this evening.  On reassessment, patient remained well-appearing with no symptoms.  Discussed cardiology recommendations and need for close follow-up with his primary cardiologist and continuation of outpatient plan and strict precautions.  After the discussed management above, the patient  was determined to be safe for discharge.  The patient was in agreement with this plan and all questions regarding their care were answered.  ED return precautions were discussed and the patient will return to the ED with any significant worsening of condition.  Final Clinical Impression(s) / ED Diagnoses Final diagnoses:  Chest pain, unspecified type    Rx / DC Orders ED Discharge Orders    None       Lucrezia Starch, MD 12/22/20 1227

## 2020-12-23 DIAGNOSIS — Z79899 Other long term (current) drug therapy: Secondary | ICD-10-CM

## 2020-12-23 DIAGNOSIS — I5041 Acute combined systolic (congestive) and diastolic (congestive) heart failure: Secondary | ICD-10-CM

## 2020-12-26 NOTE — Telephone Encounter (Signed)
BP looks okay, would add low-dose losartan (12.5 mg daily).  Continue to monitor BP for next 2 weeks and check BMET in 1 week

## 2020-12-27 ENCOUNTER — Encounter: Payer: Self-pay | Admitting: Internal Medicine

## 2020-12-27 NOTE — Progress Notes (Signed)
I received a note from Dr. Armandina Gemma who saw this patient on 12/20/2020 for thyroid nodule and enlarged thyroid.  Patient noted to have dominant nodule in the right thyroid gland for which biopsy demonstrated benign follicular nodule.  Patient assessed to have mild compressive symptoms with occasional solid food dysphagia.  Thyroid function is normal.  He stated that at this time the patient does not require surgical intervention.  However he plans to monitor his thyroid nodules with a repeat thyroid ultrasound in 1 year and repeat TSH level at that time.  He asked that the patient return for follow-up in 1 year after having the studies done.

## 2020-12-28 ENCOUNTER — Telehealth (HOSPITAL_BASED_OUTPATIENT_CLINIC_OR_DEPARTMENT_OTHER): Payer: 59 | Admitting: Internal Medicine

## 2020-12-28 ENCOUNTER — Encounter: Payer: Self-pay | Admitting: Internal Medicine

## 2020-12-28 DIAGNOSIS — E041 Nontoxic single thyroid nodule: Secondary | ICD-10-CM | POA: Diagnosis not present

## 2020-12-28 DIAGNOSIS — I5020 Unspecified systolic (congestive) heart failure: Secondary | ICD-10-CM | POA: Insufficient documentation

## 2020-12-28 DIAGNOSIS — R1319 Other dysphagia: Secondary | ICD-10-CM

## 2020-12-28 DIAGNOSIS — R634 Abnormal weight loss: Secondary | ICD-10-CM

## 2020-12-28 DIAGNOSIS — E010 Iodine-deficiency related diffuse (endemic) goiter: Secondary | ICD-10-CM

## 2020-12-28 DIAGNOSIS — G4700 Insomnia, unspecified: Secondary | ICD-10-CM | POA: Insufficient documentation

## 2020-12-28 MED ORDER — LOSARTAN POTASSIUM 25 MG PO TABS: 12.5000 mg | ORAL_TABLET | Freq: Every day | ORAL | 3 refills | Status: DC

## 2020-12-28 NOTE — Progress Notes (Addendum)
Virtual Visit via Telephone Note  I connected with Marcus Hernandez on 12/28/20 at 4 p.m by telephone and verified that I am speaking with the correct person using two identifiers.  Location: Patient: home Provider: office  The patient, my CMA Ms. Marcus Hernandez and myself participated in this encounter. I discussed the limitations, risks, security and privacy concerns of performing an evaluation and management service by telephone and the availability of in person appointments. I also discussed with the patient that there may be a patient responsible charge related to this service. The patient expressed understanding and agreed to proceed.   History of Present Illness: Patient with history of anxiety, atypical chest pain, thyromegaly, thyroid nodule with benign biopsy.  Last 10/2020  Thyromegaly: Since last visit, patient had biopsy of the right dominant thyroid nodule.  Pathology revealed benign follicular changes.  I sent him to the surgeon Dr. Harlow Asa to see if he would be a candidate for partial thyroidectomy given the size of the nodule and intermittent dysphagia to solids.  On barium swallow it had showed gentle leftward deviation of the esophageal: In the lower neck, cannot exclude right eccentric thyroid goiter as a possible cause. -Dr. Gala Lewandowsky assessment was that the patient did not need surgery at this time.  He recommends follow-up in 1 year with repeat thyroid ultrasound and TSH.  Today patient tells me that he still gets dysphagia to solids intermittently and randomly.  States that it can come on and last several days.  He has not had any dysphagia over the past 2 weeks.  Continued to have some weight loss.  When I saw him in November, his weight was 160 pounds.  Most recent weight reading is 149 pounds.  He states for the past month his weight has stayed between 149 to 151 pounds.  He attributes some weight loss to taking Zoloft for several days.  The medication decreased his appetite and  cause chest pains.  I had him discontinue that and we tried him with BuSpar instead.  However patient never filled the prescription.  Has hydralazine but has only taken it once.  Complains of problems staying asleep for the past 2 to 3 months.  He falls asleep easily but then wakes up about every hour.  Sometimes when he wakes up he is up because of feelings of palpitations or chest pains.  He does not drink any caffeinated beverages in the evenings.  He has tried taking sleepy time tea and melatonin without improvement.  CP: saw cardiologist Dr. Gardiner Rhyme for evaluation of chest pains and palpitations.  He wore a monitor for a week.  Predominant rhythm was sinus rhythm with isolated atrial and ventricular ectopy less than 1% of the times.  It was noted that 23 of the times when he reported feelings of heart racing, the rhythm was sinus rhythm.  He also had an echo that revealed EF of 40 to 45%.  The cardiologist just started him on Cozaar today.  Cardiac CT and MRI have been ordered.   Outpatient Encounter Medications as of 12/28/2020  Medication Sig Note  . busPIRone (BUSPAR) 5 MG tablet Take 1 tablet (5 mg total) by mouth 2 (two) times daily. 12/14/2020: NOT TAKING AT THIS TIME  . hydrOXYzine (ATARAX/VISTARIL) 25 MG tablet Take 1 tablet (25 mg total) by mouth 3 (three) times daily as needed. 12/14/2020: NOT TAKING AT THIS TIME  . losartan (COZAAR) 25 MG tablet Take 0.5 tablets (12.5 mg total) by mouth daily.   Marcus Hernandez  metoprolol succinate (TOPROL XL) 25 MG 24 hr tablet Take 1 tablet (25 mg total) by mouth daily.   . metoprolol tartrate (LOPRESSOR) 50 MG tablet Take 50 mg (1 tablet) TWO hours prior to CT    No facility-administered encounter medications on file as of 12/28/2020.      Observations/Objective: Depression screen [facility] 2/9 11/22/2020 10/31/2020  Decreased Interest 2 3  Down, Depressed, Hopeless 2 1  PHQ - 2 Score 4 4  Altered sleeping 2 3  Tired, decreased energy 3 2  Change in appetite 2  2  Feeling bad or failure about yourself  1 1  Trouble concentrating 1 2  Moving slowly or fidgety/restless 1 0  Suicidal thoughts 0 0  PHQ-9 Score 14 14   Lab Results  Component Value Date   TSH 1.170 11/14/2020   Lab Results  Component Value Date   WBC 9.2 12/16/2020   HGB 13.7 12/16/2020   HCT 43.9 12/16/2020   MCV 73.7 (L) 12/16/2020   PLT 324 12/16/2020     Assessment and Plan: 1. Thyromegaly 2. Thyroid nodule Biopsy of dominant nodule in the right thyroid gland reveals benign pathology.  Plan is for repeat TSH and thyroid ultrasound in 1 year.  3. Systolic heart failure, unspecified HF chronicity ([place]) Recent diagnosis.  Cardiology has ordered additional testing for further work-up.  Advised patient of importance of low-salt diet.  I have sent some information to him via MyChart on congestive heart failure.  4. Weight loss 5. Intermittent dysphagia -I think the intermittent dysphagia is likely due to to the enlarged thyroid in particular the large nodule on the right thyroid gland.  He was evaluated by the surgeon Dr. Harlow Asa and it was felt that surgery is not indicated at this time. He continues to have some weight loss though he states that the weight has been stable over the past month.  Will refer to GI for further evaluation. - Ambulatory referral to Gastroenterology  6. Insomnia, unspecified type Good sleep hygiene discussed and encouraged.  Advised patient that he may want to try the hydroxyzine at bedtime as it will help with anxiety and insomnia.   Follow Up Instructions: 2 mths   I discussed the assessment and treatment plan with the patient. The patient was provided an opportunity to ask questions and all were answered. The patient agreed with the plan and demonstrated an understanding of the instructions.   The patient was advised to call back or seek an in-person evaluation if the symptoms worsen or if the condition fails to improve as anticipated.  I  provided 20 minutes of non-face-to-face time during this encounter.   Karle Plumber, MD

## 2021-01-02 ENCOUNTER — Encounter: Payer: Self-pay | Admitting: Dietician

## 2021-01-02 ENCOUNTER — Encounter: Payer: 59 | Attending: Internal Medicine | Admitting: Dietician

## 2021-01-02 DIAGNOSIS — R634 Abnormal weight loss: Secondary | ICD-10-CM | POA: Insufficient documentation

## 2021-01-02 NOTE — Progress Notes (Signed)
Medical Nutrition Therapy  Appointment Start time:  6761  Appointment End time:  8  Primary concerns today: Weight gain  Referral diagnosis: R63.4 Weight loss Preferred learning style: No preference indicated Learning readiness: Change in progress   NUTRITION ASSESSMENT   Visit was conducted on the MyChart virtual platform  Anthropometrics  No measurement collected today   Clinical Medical Hx: CHF, Wt loss, anxiety Medications: Losartan, Metoprolol Succinate, Buspar Labs: N/A Notable Signs/Symptoms: Random fatigue, angina, heart palpatations  Lifestyle & Dietary Hx Pt reports a diagnosis of heart failure at the end of December. Left ventricle is only putting out 40 percent. Pt reports not being able to sleep, chest pains, and weird sick feeling that runs through his body sometimes. Pt states the feeling is difficult to explain. Pt reports this feeling happens a few times a week. Pt reports anxiety over his health and would set an alarm to wake up for every hour because he was concerned something would happen to him in his sleep. Pt reports having more of an appetite in recent weeks. Pt wants to gain weight, but do it in a way that is good for his heart. Pt reports starting to eat fish recently. Now eats it 2-3 times a week. Pt reports reading food labels now to monitor sodium content, wts to eat less than 2,000 mg a day. Pt has cut down on high fat meat, eats ground [place] and chicken breast Pt usually doesn't eat breakfast, but states that when we eats breakfast he tends to be hungrier throughout the day.   Estimated daily fluid intake: 2 liters a day Supplements: None Sleep: Very poor. Pt can fall asleep, but will wake up hourly. Pt tried hydroxyzine, reports e's not sure if it works. Stress / self-care: Pt reports GD over his health. Currently takes Buspar Current average weekly physical activity: ADLs  24-Hr Dietary Recall First Meal: 1 egg, a little salmon, OJ Snack:  none Second Meal: Couple cookies, breakfast biscuits Snack: none Third Meal: (9-10 PM) [place] meatballs, macaroni and cheese Snack: none Beverages: Water, V8 splashnone  Estimated Energy Needs Calories: 2,200 kcal Protein: 80 g   NUTRITION DIAGNOSIS  [place]-3.2 Unintentional weight loss As related to insufficent caloric intake.  As evidenced by 24 hr recall, self reported lack of appetite, and recent weight loss.   NUTRITION INTERVENTION  Nutrition education (E-1) on the following topics:  . Heart healthy fats. . High protein high calorie nutrition for healthy weight gain.  Handouts Provided Include  High Calorie, High Protein Nutritoin   Learning Style & Readiness for Change Teaching method utilized: Visual & Auditory  Demonstrated degree of understanding via: Teach Back  Barriers to learning/adherence to lifestyle change: None  Goals Established by Pt  Continue to follow the guidelines to promote heart health.  Low saturated fat  Increase heart healthy fat food items like fish, nuts, seeds and oils  Limit sodium to no more than 2,000 mg a day.  Consider borrowing your sisters blender and making a shake with 2% milk, peanut or almond butter, and a protein powder.  Try smoked paprika, chili powder, cumin, and garlic powder for a salt free dry seasoning blend  Eat breakfast each morning. Cook your eggs in a plant oil   MONITORING & EVALUATION Dietary intake, weekly physical activity, and body weight in 1 month.  Next Steps  Patient is to follow up with dietitian .

## 2021-01-02 NOTE — Patient Instructions (Addendum)
Continue to follow the guidelines to promote heart health. Low saturated fat Increase heart healthy fat food items like fish, nuts, seeds and oils Limit sodium to no more than 2,000 mg a day.  Consider borrowing your sisters blender and making a shake with 2% milk, peanut or almond butter, and a protein powder.  Try smoked paprika, chili powder, cumin, and garlic powder for a salt free dry seasoning blend  Eat breakfast each morning. Cook your eggs in a plant oil

## 2021-01-08 ENCOUNTER — Other Ambulatory Visit: Payer: Self-pay | Admitting: *Deleted

## 2021-01-08 ENCOUNTER — Other Ambulatory Visit: Payer: Self-pay

## 2021-01-08 DIAGNOSIS — I5041 Acute combined systolic (congestive) and diastolic (congestive) heart failure: Secondary | ICD-10-CM

## 2021-01-08 DIAGNOSIS — Z79899 Other long term (current) drug therapy: Secondary | ICD-10-CM

## 2021-01-08 DIAGNOSIS — R079 Chest pain, unspecified: Secondary | ICD-10-CM

## 2021-01-09 LAB — BASIC METABOLIC PANEL
BUN/Creatinine Ratio: 9 (ref 9–20)
BUN: 9 mg/dL (ref 6–20)
CO2: 24 mmol/L (ref 20–29)
Calcium: 9.8 mg/dL (ref 8.7–10.2)
Chloride: 104 mmol/L (ref 96–106)
Creatinine, Ser: 1.05 mg/dL (ref 0.76–1.27)
GFR calc Af Amer: 106 mL/min/{1.73_m2} (ref >59)
GFR calc non Af Amer: 92 mL/min/{1.73_m2} (ref >59)
Glucose: 82 mg/dL (ref 65–99)
Potassium: 4.9 mmol/L (ref 3.5–5.2)
Sodium: 142 mmol/L (ref 134–144)

## 2021-01-14 NOTE — Progress Notes (Deleted)
Cardiology Office Note:    Date:  01/14/2021   ID:  Marcus Hernandez, DOB [birth year], MRN [id number]  PCP:  Ladell Pier, MD  Cardiologist:  No primary care provider on file.  Electrophysiologist:  None   Referring MD: Ladell Pier, MD   No chief complaint on file.  History of Present Illness:    Marcus Hernandez is a 35 y.o. male with a hx of COVID-19 infection 07/2020 who presents for follow-up.  He was referred by Dr. Wynetta Emery for evaluation of chest pain, initially seen on 11/16/2020.  He reports that his symptoms started in October 2021.  Describes daily chest pain that occurs at different spots on the chest.  Sometimes can be left-sided, sometimes in the center, and sometimes right-sided pain.  Reports pain is either sharp or tingling sensation.  Typically just lasts for few seconds and resolves.  Sometimes the tingling sensation can last up to 20 minutes.  He does not note anything that causes it.  Does not exercise regularly but has not noted a relationship with exertion.  States that pain occurs at rest.  Also reports that he has been having shortness of breath.  Reports having lightheadedness but denies any syncope.  No lower extremity edema.  Also having palpitations that occur multiple times daily.  Palpitations started the last 2 to 3 weeks.  Occurs at rest.  States that it feels like his heart is racing during the episodes, will last for minutes.  Went to ED on 11/30 for the symptoms.  Troponins were negative.  Was prescribed metoprolol for his palpitations but has not started taking yet.  He smoked cigarettes for 2 to 3 years, 3 to 4 cigarettes/day.  Also smoked cigars for 2 to 3 years, quit in 2014.  No history of heart disease in his immediate family.  Recently found a thyroid nodules, biopsy is being planned.  Normal TSH/free T4 on 11/30.  Echocardiogram on 12/11/2020 showed LVEF 40 to 45%, global hypokinesis more pronounced in apical segment, normal RV function, no  significant valvular disease.  Zio patch x7 days showed no abnormalities.  Since last clinic visit,   he reports that he has continued to have chest pain, occurs every day.  Also continues have shortness of breath.  No change in palpitations.   BP Readings from Last 3 Encounters:  12/16/20 122/83  12/14/20 98/71  11/24/20 108/76     Past Medical History:  Diagnosis Date  . Axillary abscess     No past surgical history on file.  Current Medications: No outpatient medications have been marked as taking for the 01/15/21 encounter (Appointment) with Donato Heinz, MD.     Allergies:   Shrimp [shellfish allergy]   Social History   Socioeconomic History  . Marital status: Single    Spouse name: Not on file  . Number of children: 3  . Years of education: Not on file  . Highest education level: Not on file  Occupational History  . Occupation: [employer]  Tobacco Use  . Smoking status: Former Smoker    Types: Cigars  . Smokeless tobacco: Never Used  Vaping Use  . Vaping Use: Never used  Substance and Sexual Activity  . Alcohol use: Yes    Comment: weekly  . Drug use: No  . Sexual activity: Not on file  Other Topics Concern  . Not on file  Social History Narrative  . Not on file   Social Determinants of Health  Financial Resource Strain: Not on file  Food Insecurity: Not on file  Transportation Needs: Not on file  Physical Activity: Not on file  Stress: Not on file  Social Connections: Not on file     Family History: No known history of heart disease in his immediate family  ROS:   Please see the history of present illness.     All other systems reviewed and are negative.  EKGs/Labs/Other Studies Reviewed:    The following studies were reviewed today:   EKG:  EKG is ordered today.  The ekg ordered today demonstrates normal sinus rhythm, rate 72, no ST abnormalities  Recent Labs: 11/14/2020: TSH 1.170 12/16/2020: Hemoglobin 13.7; Platelets  324 01/08/2021: BUN 9; Creatinine, Ser 1.05; Potassium 4.9; Sodium 142  Recent Lipid Panel No results found for: CHOL, TRIG, HDL, CHOLHDL, VLDL, LDLCALC, LDLDIRECT  Physical Exam:    VS:  There were no vitals taken for this visit.    Wt Readings from Last 3 Encounters:  12/14/20 149 lb 3.2 oz (67.7 kg)  11/24/20 152 lb (68.9 kg)  11/16/20 155 lb 9.6 oz (70.6 kg)     GEN: Well nourished, well developed in no acute distress HEENT: Normal NECK: No JVD; No carotid bruits LYMPHATICS: No lymphadenopathy CARDIAC: RRR, no murmurs, rubs, gallops RESPIRATORY:  Clear to auscultation without rales, wheezing or rhonchi  ABDOMEN: Soft, non-tender, non-distended MUSCULOSKELETAL:  No edema; No deformity  SKIN: Warm and dry NEUROLOGIC:  Alert and oriented x 3 PSYCHIATRIC:  Normal affect   ASSESSMENT:    No diagnosis found. PLAN:    Acute combined systolic and diastolic heart failure: reported chest pain/dyspnea.  Echocardiogram on 12/11/2020 showed LVEF 40 to 45%, global hypokinesis more pronounced in apical segments, normal RV function, no significant valvular disease.  Suspect myocarditis due to recent COVID-19 infection -Stress cardiac MRI to evaluate for myocarditis.  Low suspicion for CAD given age/lack of risk factors, but given his persistent chest pain, will check stress CMR to rule out ischemia -Continue Toprol-XL 25 mg daily -Continue losartan 12.5 mg daily  Palpitations: Zio patch x7 days showed no abnormalities.  RTC in 1 month  Medication Adjustments/Labs and Tests Ordered: Current medicines are reviewed at length with the patient today.  Concerns regarding medicines are outlined above.  No orders of the defined types were placed in this encounter.  No orders of the defined types were placed in this encounter.   There are no Patient Instructions on file for this visit.   Signed, Donato Heinz, MD  01/14/2021 8:32 PM    [place]

## 2021-01-15 ENCOUNTER — Ambulatory Visit: Payer: Self-pay | Admitting: Cardiology

## 2021-01-15 ENCOUNTER — Encounter (HOSPITAL_COMMUNITY): Payer: Self-pay

## 2021-01-15 ENCOUNTER — Other Ambulatory Visit (HOSPITAL_COMMUNITY): Payer: Self-pay | Admitting: Emergency Medicine

## 2021-01-15 DIAGNOSIS — R079 Chest pain, unspecified: Secondary | ICD-10-CM

## 2021-01-16 ENCOUNTER — Telehealth (HOSPITAL_COMMUNITY): Payer: Self-pay | Admitting: Emergency Medicine

## 2021-01-16 NOTE — Telephone Encounter (Signed)
Pt returning phone call stating his appt was cancelled for unknown reason.   Phone call made to MR tech and pre-cert team to ensure new test (stress MR) has auth for scheduling.   Awaiting response before I call patient back.  Marchia Bond RN Navigator Cardiac Imaging [facility] [phone] Office  [phone] Cell

## 2021-01-16 NOTE — Telephone Encounter (Signed)
Attempted to call patient regarding upcoming cardiac MR appointment. Left message on voicemail with name and callback number Merritt Kibby RN Navigator Cardiac Imaging [facility] [phone] Office [phone] Cell  

## 2021-01-17 ENCOUNTER — Other Ambulatory Visit: Payer: Self-pay

## 2021-01-17 ENCOUNTER — Ambulatory Visit (HOSPITAL_COMMUNITY)
Admission: RE | Admit: 2021-01-17 | Discharge: 2021-01-17 | Disposition: A | Discharge status: Home / Self Care | Payer: 59 | Source: Ambulatory Visit | Attending: Cardiology | Admitting: Cardiology

## 2021-01-17 ENCOUNTER — Ambulatory Visit (HOSPITAL_COMMUNITY): Admission: RE | Admit: 2021-01-17 | Payer: 59 | Source: Ambulatory Visit

## 2021-01-17 ENCOUNTER — Other Ambulatory Visit (HOSPITAL_COMMUNITY): Payer: 59

## 2021-01-17 DIAGNOSIS — I5041 Acute combined systolic (congestive) and diastolic (congestive) heart failure: Secondary | ICD-10-CM

## 2021-01-17 MED ORDER — GADOBUTROL 1 MMOL/ML IV SOLN
9.0000 mL | Freq: Once | INTRAVENOUS | Status: AC | PRN
  Administered 2021-01-17: 9 mL via INTRAVENOUS

## 2021-01-19 ENCOUNTER — Other Ambulatory Visit: Payer: Self-pay | Admitting: *Deleted

## 2021-01-19 DIAGNOSIS — R079 Chest pain, unspecified: Secondary | ICD-10-CM

## 2021-01-25 ENCOUNTER — Encounter (HOSPITAL_COMMUNITY): Payer: Self-pay

## 2021-01-25 ENCOUNTER — Telehealth (HOSPITAL_COMMUNITY): Payer: Self-pay | Admitting: *Deleted

## 2021-01-25 NOTE — Telephone Encounter (Signed)
Reaching out to patient to offer assistance regarding upcoming cardiac imaging study; pt verbalizes understanding of appt date/time, parking situation and where to check in, pre-test NPO status and medications ordered, and verified current allergies; name and call back number provided for further questions should they arise  Gordy Clement RN [facility] [phone] office [phone] cell  Instructions also sent to pt's MyChart per his request.

## 2021-01-26 ENCOUNTER — Ambulatory Visit (HOSPITAL_COMMUNITY)
Admission: RE | Admit: 2021-01-26 | Discharge: 2021-01-26 | Disposition: A | Discharge status: Home / Self Care | Payer: 59 | Source: Ambulatory Visit | Attending: Cardiology | Admitting: Cardiology

## 2021-01-26 ENCOUNTER — Other Ambulatory Visit (HOSPITAL_COMMUNITY): Payer: 59

## 2021-01-26 ENCOUNTER — Other Ambulatory Visit: Payer: Self-pay

## 2021-01-26 DIAGNOSIS — R079 Chest pain, unspecified: Secondary | ICD-10-CM | POA: Insufficient documentation

## 2021-01-26 MED ORDER — IOHEXOL 350 MG/ML SOLN
80.0000 mL | Freq: Once | INTRAVENOUS | Status: AC | PRN
  Administered 2021-01-26: 80 mL via INTRAVENOUS

## 2021-01-26 MED ORDER — NITROGLYCERIN 0.4 MG SL SUBL
SUBLINGUAL_TABLET | SUBLINGUAL | Status: AC
  Filled 2021-01-26: qty 1

## 2021-01-26 MED ORDER — NITROGLYCERIN 0.4 MG SL SUBL
0.8000 mg | SUBLINGUAL_TABLET | Freq: Once | SUBLINGUAL | Status: AC | PRN
  Administered 2021-01-26: 0.4 mg via SUBLINGUAL

## 2021-01-26 NOTE — Progress Notes (Signed)
CT scan completed. Tolerate dwell. Pt able to walk without assistance, states he feels ok. D/C home ambulatory with friend. Awake and alert. In no distress

## 2021-01-28 NOTE — Progress Notes (Signed)
Cardiology Office Note:    Date:  01/29/2021   ID:  Marcus Hernandez, DOB Aug 31, 1986, MRN [id number]  PCP:  Ladell Pier, MD  Cardiologist:  No primary care provider on file.  Electrophysiologist:  None   Referring MD: Ladell Pier, MD   Chief Complaint  Patient presents with  . Chest Pain   History of Present Illness:    Marcus Hernandez is a 35 y.o. male with a hx of COVID-19 infection 07/2020 who presents for follow-up.  He was referred by Dr. Wynetta Emery for evaluation of chest pain, initially seen on 11/16/2020.  He reports that his symptoms started in October 2021.  Describes daily chest pain that occurs at different spots on the chest.  Sometimes can be left-sided, sometimes in the center, and sometimes right-sided pain.  Reports pain is either sharp or tingling sensation.  Typically just lasts for few seconds and resolves.  Sometimes the tingling sensation can last up to 20 minutes.  He does not note anything that causes it.  Does not exercise regularly but has not noted a relationship with exertion.  States that pain occurs at rest.  Also reports that he has been having shortness of breath.  Reports having lightheadedness but denies any syncope.  No lower extremity edema.  Also having palpitations that occur multiple times daily.  Palpitations started the last 2 to 3 weeks.  Occurs at rest.  States that it feels like his heart is racing during the episodes, will last for minutes.  Went to ED on 11/30 for the symptoms.  Troponins were negative.  Was prescribed metoprolol for his palpitations but has not started taking yet.  He smoked cigarettes for 2 to 3 years, 3 to 4 cigarettes/day.  Also smoked cigars for 2 to 3 years, quit in 2014.  No history of heart disease in his immediate family.  Recently found a thyroid nodules, biopsy is being planned.  Normal TSH/free T4 on 11/30.  Echocardiogram on 12/11/2020 showed LVEF 40 to 45%, global hypokinesis more pronounced in apical segment,  normal RV function, no significant valvular disease.  Zio patch x7 days showed no abnormalities.  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  Coronary CTA on 01/26/2021 showed calcium score 0, significant respiratory motion artifact but no CAD seen.  Since last clinic visit, reports he continues to have chest pain but states it is improved.  Also reports that shortness of breath has improved.  Has been having lightheadedness with standing.  Denies any lower extremity edema.  Reports palpitations have improved.   BP Readings from Last 3 Encounters:  01/29/21 110/75  01/26/21 101/72  12/16/20 122/83     Past Medical History:  Diagnosis Date  . Axillary abscess     No past surgical history on file.  Current Medications: Current Meds  Medication Sig  . busPIRone (BUSPAR) 5 MG tablet Take 1 tablet (5 mg total) by mouth 2 (two) times daily.  . hydrOXYzine (ATARAX/VISTARIL) 25 MG tablet Take 1 tablet (25 mg total) by mouth 3 (three) times daily as needed.  Marland Kitchen losartan (COZAAR) 25 MG tablet Take 0.5 tablets (12.5 mg total) by mouth daily.  . metoprolol succinate (TOPROL XL) 25 MG 24 hr tablet Take 1 tablet (25 mg total) by mouth daily.  Marland Kitchen  metoprolol tartrate (LOPRESSOR) 50 MG tablet Take 50 mg (1 tablet) TWO hours prior to CT     Allergies:   Shrimp [shellfish allergy]   Social History   Socioeconomic History  . Marital status: Single    Spouse name: Not on file  . Number of children: 3  . Years of education: Not on file  . Highest education level: Not on file  Occupational History  . Occupation: [employer]  Tobacco Use  . Smoking status: Former Smoker    Types: Cigars  . Smokeless tobacco: Never Used  Vaping Use  . Vaping Use: Never used  Substance and Sexual  Activity  . Alcohol use: Yes    Comment: weekly  . Drug use: No  . Sexual activity: Not on file  Other Topics Concern  . Not on file  Social History Narrative  . Not on file   Social Determinants of Health   Financial Resource Strain: Not on file  Food Insecurity: Not on file  Transportation Needs: Not on file  Physical Activity: Not on file  Stress: Not on file  Social Connections: Not on file     Family History: No known history of heart disease in his immediate family  ROS:   Please see the history of present illness.     All other systems reviewed and are negative.  EKGs/Labs/Other Studies Reviewed:    The following studies were reviewed today:   EKG:  EKG is ordered today.  The ekg ordered today demonstrates normal sinus rhythm, rate 65, TWI in lead v3  Recent Labs: 11/14/2020: TSH 1.170 12/16/2020: Hemoglobin 13.7; Platelets 324 01/08/2021: BUN 9; Creatinine, Ser 1.05; Potassium 4.9; Sodium 142  Recent Lipid Panel No results found for: CHOL, TRIG, HDL, CHOLHDL, VLDL, LDLCALC, LDLDIRECT  Physical Exam:    VS:  BP 110/75   Pulse 65   Ht 5\' 6"  (1.676 m)   Wt 142 lb 3.2 oz (64.5 kg)   SpO2 97%   BMI 22.95 kg/m     Wt Readings from Last 3 Encounters:  01/29/21 142 lb 3.2 oz (64.5 kg)  12/14/20 149 lb 3.2 oz (67.7 kg)  11/24/20 152 lb (68.9 kg)     GEN: Well nourished, well developed in no acute distress HEENT: Normal NECK: No JVD; No carotid bruits CARDIAC: RRR, no murmurs, rubs, gallops RESPIRATORY:  Clear to auscultation without rales, wheezing or rhonchi  ABDOMEN: Soft, non-tender, non-distended MUSCULOSKELETAL:  No edema; No deformity  SKIN: Warm and dry NEUROLOGIC:  Alert and oriented x 3 PSYCHIATRIC:  Normal affect   ASSESSMENT:    1. 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 Chest pain of uncertain etiology   3. Palpitations   4. Liver lesion    PLAN:    Acute combined systolic and  diastolic heart failure: reported chest pain/dyspnea.  Echocardiogram on 12/11/2020 showed LVEF 40 to 45%, global hypokinesis more pronounced in apical segments, normal RV function, no significant valvular disease.  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  Coronary CTA on 01/26/2021 showed calcium score 0, significant respiratory motion artifact but no CAD seen.  Differential diagnosis includes myocarditis due to recent COVID-19 infection versus LV noncompaction cardiomyopathy -Continue Toprol-XL 25 mg daily -Continue losartan 12.5 mg daily  Chest pain: Coronary CTA unremarkable as above.  Suspect noncardiac chest pain, recommend PCP follow-up for further evaluation  Palpitations: Zio patch x7 days showed no abnormalities.  Reports has significantly improved since starting on metoprolol, would continue  Liver lesions: Noted on CTA chest to have 2 small hypervascular liver lesions.  Recommend PCP follow-up.  RTC in 6 months  Medication Adjustments/Labs and Tests Ordered: Current medicines are reviewed at length with the patient today.  Concerns regarding medicines are outlined above.  Orders Placed This Encounter  Procedures  . EKG 12-Lead   No orders of the defined types were placed in this encounter.   Patient Instructions  Medication Instructions:  Your physician recommends that you continue on your current medications as directed. Please refer to the Current Medication list given to you today.  *If you need a refill on your cardiac medications before your next appointment, please call your pharmacy*  Follow-Up: At [facility], you and your health needs are our priority.  As part of our continuing mission to provide you with exceptional heart care, we have  created designated Provider Care Teams.  These Care Teams include your primary Cardiologist (physician) and Advanced Practice Providers (APPs -  Physician Assistants and Nurse Practitioners) who all work together to provide you with the care you need, when you need it.  We recommend signing up for the patient portal called "MyChart".  Sign up information is provided on this After Visit Summary.  MyChart is used to connect with patients for Virtual Visits (Telemedicine).  Patients are able to view lab/test results, encounter notes, upcoming appointments, etc.  Non-urgent messages can be sent to your provider as well.   To learn more about what you can do with MyChart, go to [email or web address].    Your next appointment:   6 month(s)  The format for your next appointment:   In Person  Provider:   Oswaldo Milian, MD   Other Instructions Follow up with primary care doctor soon in regards to the CT findings     Signed, Donato Heinz, MD  01/29/2021 5:42 PM    [place]

## 2021-01-29 ENCOUNTER — Ambulatory Visit (INDEPENDENT_AMBULATORY_CARE_PROVIDER_SITE_OTHER): Payer: 59 | Admitting: Cardiology

## 2021-01-29 ENCOUNTER — Other Ambulatory Visit: Payer: Self-pay

## 2021-01-29 VITALS — BP 110/75 | HR 65 | Ht 66.0 in | Wt 142.2 lb

## 2021-01-29 DIAGNOSIS — K769 Liver disease, unspecified: Secondary | ICD-10-CM | POA: Diagnosis not present

## 2021-01-29 DIAGNOSIS — R002 Palpitations: Secondary | ICD-10-CM

## 2021-01-29 DIAGNOSIS — I5041 Acute combined systolic (congestive) and diastolic (congestive) heart failure: Secondary | ICD-10-CM | POA: Diagnosis not present

## 2021-01-29 DIAGNOSIS — R079 Chest pain, unspecified: Secondary | ICD-10-CM

## 2021-01-29 NOTE — Patient Instructions (Signed)
Medication Instructions:  Your physician recommends that you continue on your current medications as directed. Please refer to the Current Medication list given to you today.  *If you need a refill on your cardiac medications before your next appointment, please call your pharmacy*  Follow-Up: At [facility], you and your health needs are our priority.  As part of our continuing mission to provide you with exceptional heart care, we have created designated Provider Care Teams.  These Care Teams include your primary Cardiologist (physician) and Advanced Practice Providers (APPs -  Physician Assistants and Nurse Practitioners) who all work together to provide you with the care you need, when you need it.  We recommend signing up for the patient portal called "MyChart".  Sign up information is provided on this After Visit Summary.  MyChart is used to connect with patients for Virtual Visits (Telemedicine).  Patients are able to view lab/test results, encounter notes, upcoming appointments, etc.  Non-urgent messages can be sent to your provider as well.   To learn more about what you can do with MyChart, go to [email or web address].    Your next appointment:   6 month(s)  The format for your next appointment:   In Person  Provider:   Oswaldo Milian, MD   Other Instructions Follow up with primary care doctor soon in regards to the CT findings

## 2021-02-05 ENCOUNTER — Encounter: Payer: 59 | Attending: Internal Medicine | Admitting: Dietician

## 2021-02-05 ENCOUNTER — Other Ambulatory Visit: Payer: Self-pay

## 2021-02-05 ENCOUNTER — Encounter: Payer: Self-pay | Admitting: Dietician

## 2021-02-05 DIAGNOSIS — R634 Abnormal weight loss: Secondary | ICD-10-CM | POA: Insufficient documentation

## 2021-02-05 NOTE — Patient Instructions (Addendum)
Have a peanut butter and banana sandwich on whole wheat bread.  Allow yourself to eat a more liberal diet. Have some chinese food, but choose low sodium soy sauce.  Split higher sodium meals into smaller meals.  Make a pizza with Pomi tomato sauce, [place] pepperoni, and reduced sodium cheese.

## 2021-02-05 NOTE — Progress Notes (Signed)
Medical Nutrition Therapy  Appointment Start time:  15  Appointment End time:  81  Primary concerns today: Weight gain  Referral diagnosis: R63.4 Weight loss Preferred learning style: No preference indicated Learning readiness: Change in progress   NUTRITION ASSESSMENT   Anthropometrics  Wt: 143 Ht: 5"6" Body mass index is 23.08 kg/m.   Clinical Medical Hx: CHF, Wt loss, anxiety Medications: Losartan, Metoprolol Succinate, Buspar Labs: N/A Notable Signs/Symptoms: Random fatigue, angina, heart palpatations   Lifestyle & Dietary Hx Pt recently visited cardiologist, scans found no blockages and EF increased slightly. Pt reports trying to keep daily sodium intake under 2,000. Pt reports their nurse told them they did not need to monitor their sodium. Pt reports adding in some yogurt/parfait for breakfast or a snack. Pt states when they eat breakfast, they are hungry in the afternoon. Pt reports drinking a half bottle of ensure or premier protein occasionally. Pt reports having an appetite, but can't eat what he wants to eat. Pt reports usually eating deli foods, chinese or japanese foods in the past but won't eat them because of high sodium content. Pt reports checking BP at home, normal reading of around 108/72. Pt reports slight fear over eating due to previous cardiovascular health concerns.   Estimated daily fluid intake: ~50 oz. a day Supplements: None Sleep: Very poor. Pt can fall asleep, but will wake up hourly. Pt tried hydroxyzine, reports e's not sure if it works. Stress / self-care: Pt reports GD over his health. Currently takes Buspar Current average weekly physical activity: ADLs  24-Hr Dietary Recall First Meal: Bowl of honey oat and almonds, 2% milk Snack: none Second Meal: Bit of chicken tender, handful of trail mix Snack: none Third Meal: Baked chicken rice and green beans. Snack: none Beverages: Water, sugar free orangeaid  Estimated Energy  Needs Calories: 2,200 kcal Protein: 80 g   NUTRITION DIAGNOSIS  -3.2 Unintentional weight loss As related to insufficent caloric intake.  As evidenced by 24 hr recall, self reported lack of appetite, and recent weight loss.   NUTRITION INTERVENTION  Nutrition education (E-1) on the following topics:  . Heart healthy fats. . High protein high calorie nutrition for healthy weight gain.  Handouts Provided Include  High Calorie, High Protein Nutrition   Learning Style & Readiness for Change Teaching method utilized: Visual & Auditory  Demonstrated degree of understanding via: Teach Back  Barriers to learning/adherence to lifestyle change: None  Goals Established by Pt  Have a peanut butter and banana sandwich on whole wheat bread.  Allow yourself to eat a more liberal diet. Have some chinese food, but choose low sodium soy sauce.  Split higher sodium meals into smaller meals.  Make a pizza with Pomi tomato sauce, [place] pepperoni, and reduced sodium cheese.   MONITORING & EVALUATION Dietary intake, weekly physical activity, and body weight PRN  Next Steps  Patient is to follow up with dietitian as needed .

## 2021-02-19 ENCOUNTER — Ambulatory Visit: Payer: Self-pay | Admitting: Cardiology

## 2021-02-21 ENCOUNTER — Ambulatory Visit (INDEPENDENT_AMBULATORY_CARE_PROVIDER_SITE_OTHER): Payer: 59 | Admitting: Gastroenterology

## 2021-02-21 ENCOUNTER — Other Ambulatory Visit: Payer: Self-pay | Admitting: Gastroenterology

## 2021-02-21 ENCOUNTER — Encounter: Payer: Self-pay | Admitting: Gastroenterology

## 2021-02-21 VITALS — BP 96/60 | HR 73 | Ht 68.0 in | Wt 143.0 lb

## 2021-02-21 DIAGNOSIS — R194 Change in bowel habit: Secondary | ICD-10-CM | POA: Diagnosis not present

## 2021-02-21 DIAGNOSIS — R1310 Dysphagia, unspecified: Secondary | ICD-10-CM | POA: Diagnosis not present

## 2021-02-21 DIAGNOSIS — R630 Anorexia: Secondary | ICD-10-CM

## 2021-02-21 DIAGNOSIS — R0789 Other chest pain: Secondary | ICD-10-CM

## 2021-02-21 DIAGNOSIS — R634 Abnormal weight loss: Secondary | ICD-10-CM

## 2021-02-21 MED ORDER — ESOMEPRAZOLE MAGNESIUM 40 MG PO CPDR: 40.0000 mg | DELAYED_RELEASE_CAPSULE | Freq: Every day | ORAL | 2 refills | Status: DC

## 2021-02-21 NOTE — Progress Notes (Signed)
[facility] VISIT   Primary Care Provider Ladell Pier, MD [place] [address] [phone]  Referring Provider Ladell Pier, MD [address] [place],  [place] [address] [phone]  Patient Profile: Marcus Hernandez is a 35 y.o. male with a pmh significant for CHF, anxiety, hypertension, previous COVID-19 infection.  The patient presents to the [facility] for an evaluation and management of problem(s) noted below:  Problem List 1. Unintentional weight loss   2. History of dysphagia   3. Atypical chest pain   4. Change in bowel habits   5. Anorexia     History of Present Illness This is the patient's first visit to the outpatient [facility].  The patient is accompanied with his good friend.  The patient was diagnosed with heart failure in the end of 2021.  Over the course of November/December/January he experienced a significant weight loss of approximately 30 pounds.  Over the last 6 weeks however he has had stabilization of his weight.  When his issues began he developed issues of chest pain that was nonradiating.  It is burning at times though he is not clear that this is truly acid or reflux.  At times he was having anorexia with decreased appetite.  He was experiencing nausea.  He also was having occasional issues with difficulty swallowing and pain while swallowing (dysphagia and odynophagia).  However over the last 6 weeks he no longer has any significant anorexia and no nausea.  He still has infrequent episodes of difficulty swallowing but nothing compared to what it was in the beginning of this process.  Patient has bowel movements on an every other day basis depending on what he eats.  For the last 2 months these are his bowel habits when previously he had evacuation of his bowels daily.  He has never had an upper or lower endoscopy.  GI Review of Systems Positive as above Negative for  odynophagia, hematemesis, coffee-ground emesis, bloating, melena, hematochezia  Review of Systems General: Denies fevers/chills/further weight loss in the last 6 weeks HEENT: Denies oral lesions/thrush Cardiovascular: Denies chest pain/palpitations Pulmonary: Denies shortness of breath Gastroenterological: See HPI Genitourinary: Denies darkened urine Hematological: Denies easy bruising/bleeding Endocrine: Denies temperature intolerance Dermatological: Denies jaundice Psychological: Mood is stable though he is anxious about any procedures   Medications Current Outpatient Medications  Medication Sig Dispense Refill  . busPIRone (BUSPAR) 5 MG tablet Take 1 tablet (5 mg total) by mouth 2 (two) times daily. (Patient not taking: Reported on 02/22/2021) 60 tablet 1  . hydrOXYzine (ATARAX/VISTARIL) 25 MG tablet Take 1 tablet (25 mg total) by mouth 3 (three) times daily as needed. 60 tablet 1  . losartan (COZAAR) 25 MG tablet Take 0.5 tablets (12.5 mg total) by mouth daily. 45 tablet 3  . metoprolol succinate (TOPROL XL) 25 MG 24 hr tablet Take 1 tablet (25 mg total) by mouth daily. 90 tablet 3  . omeprazole (PRILOSEC) 40 MG capsule Take 1 capsule (40 mg total) by mouth daily. 30 capsule 2   No current facility-administered medications for this visit.    Allergies Allergies  Allergen Reactions  . Shrimp [Shellfish Allergy] Swelling    To lips    Histories Past Medical History:  Diagnosis Date  . Axillary abscess   . CHF (congestive heart failure) ([place])    History reviewed. No pertinent surgical history. Social History   Socioeconomic History  . Marital status: Single  Spouse name: Not on file  . Number of children: 3  . Years of education: Not on file  . Highest education level: Not on file  Occupational History  . Occupation: [employer]  Tobacco Use  . Smoking status: Former Smoker    Types: Cigars  . Smokeless tobacco: Never Used  Vaping Use  . Vaping Use: Never used   Substance and Sexual Activity  . Alcohol use: Yes    Comment: weekly  . Drug use: No  . Sexual activity: Not on file  Other Topics Concern  . Not on file  Social History Narrative  . Not on file   Social Determinants of Health   Financial Resource Strain: Not on file  Food Insecurity: Not on file  Transportation Needs: Not on file  Physical Activity: Not on file  Stress: Not on file  Social Connections: Not on file  Intimate Partner Violence: Not on file   Family History  Problem Relation Age of Onset  . Hyperthyroidism Sister   . Prostate cancer Father   . GER disease Mother   . Colon cancer Neg Hx   . Esophageal cancer Neg Hx   . Inflammatory bowel disease Neg Hx   . Liver disease Neg Hx   . Pancreatic cancer Neg Hx   . Rectal cancer Neg Hx   . Stomach cancer Neg Hx    I have reviewed his medical, social, and family history in detail and updated the electronic medical record as necessary.    PHYSICAL EXAMINATION  BP 96/60   Pulse 73   Ht 5\' 8"  (1.727 m)   Wt 143 lb (64.9 kg)   BMI 21.74 kg/m  Wt Readings from Last 3 Encounters:  02/22/21 143 lb 12.8 oz (65.2 kg)  02/21/21 143 lb (64.9 kg)  02/05/21 143 lb (64.9 kg)  GEN: NAD, appears stated age, doesn't appear chronically ill, accompanied by friend PSYCH: Cooperative, without pressured speech EYE: Conjunctivae pink, sclerae anicteric ENT: MMM, without oral ulcers CV: Nontachycardic RESP: No audible wheezing GI: NABS, soft, NT/ND, without rebound or guarding, no HSM appreciated MSK/EXT: No lower extremity edema SKIN: No jaundice NEURO:  Alert & Oriented x 3, no focal deficits   REVIEW OF DATA  I reviewed the following data at the time of this encounter:  GI Procedures and Studies  No relevant studies to review  Laboratory Studies  Reviewed those in epic  Imaging Studies  November 2021 barium esophagram IMPRESSION: 1. Mild leftward deviation of the esophageal contrast column. Sometimes a right  eccentric thyroid goiter might cause a similar appearance, correlate with physical exam findings. 2.   Otherwise normal exam.   ASSESSMENT  Marcus Hernandez is a 35 y.o. male with a pmh significant for CHF, anxiety, hypertension, previous COVID-19 infection.  The patient is seen today for evaluation and management of:  1. Unintentional weight loss   2. History of dysphagia   3. Atypical chest pain   4. Change in bowel habits   5. Anorexia    Overall, the patient's clinically stable at this time.  Patient has had stabilization of his weight over the course of the last 6 to 8 weeks.  He still has had a significant weight loss over the course the last few months which has not rebounded as of yet.  Patient has had improvement in regards to his difficulty swallowing.  Atypical chest discomfort is slightly improved as well.  Odynophagia is improved as well.  At this point in  time a diagnostic ostomy was recommended by the patient.  We will initiate PPI therapy to see how that does in regards to the atypical chest pain.  Patient will be scheduled for follow-up.  If the patient has recurrent or persistent weight loss then diagnostic endoscopy and potentially colonoscopy can be considered in regards to his issues of changes in bowel habits as well.  All patient questions were answered to the best of my ability, and the patient agrees to the aforementioned plan of action with follow-up as indicated.   PLAN  Initiate omeprazole 40 mg daily Initiate FiberCon 1 to 2 tablets daily or Metamucil 1-2 times daily Follow-up in clinic and if patient is having persistent weight loss or progressive symptoms then diagnostic endoscopy and colonoscopy are recommended   No orders of the defined types were placed in this encounter.   New Prescriptions   OMEPRAZOLE (PRILOSEC) 40 MG CAPSULE    Take 1 capsule (40 mg total) by mouth daily.   Modified Medications   No medications on file    Planned Follow Up No  follow-ups on file.   Total Time in Face-to-Face and in Coordination of Care for patient including independent/personal interpretation/review of prior testing, medical history, examination, medication adjustment, communicating results with the patient directly, and documentation with the EHR is 45 minutes.   Justice Britain, MD [facility] Advanced Endoscopy Office # [phone]

## 2021-02-21 NOTE — Patient Instructions (Addendum)
Try Fiber Con 1-2 tablets once daily  OR  Metamucil, 1 tablespoon once or twice daily can be used to keep bowels regular if needed.  START: Nexium 40mg  - once daily.   We have sent the following medications to your pharmacy for you to pick up at your convenience: Nexium   If you are age 36 or younger, your body mass index should be between 19-25. Your Body mass index is 21.74 kg/m. If this is out of the aformentioned range listed, please consider follow up with your Primary Care Provider.   Keep follow -up appointment on: 04/06/21 @ 11:10am   Thank you for choosing me and [facility].  Dr. Rush Landmark

## 2021-02-22 ENCOUNTER — Ambulatory Visit: Payer: 59 | Attending: Internal Medicine | Admitting: Internal Medicine

## 2021-02-22 ENCOUNTER — Encounter: Payer: Self-pay | Admitting: Gastroenterology

## 2021-02-22 ENCOUNTER — Other Ambulatory Visit: Payer: Self-pay

## 2021-02-22 ENCOUNTER — Encounter: Payer: Self-pay | Admitting: Internal Medicine

## 2021-02-22 VITALS — BP 111/68 | HR 57 | Resp 16 | Wt 143.8 lb

## 2021-02-22 DIAGNOSIS — R634 Abnormal weight loss: Secondary | ICD-10-CM

## 2021-02-22 DIAGNOSIS — R194 Change in bowel habit: Secondary | ICD-10-CM | POA: Insufficient documentation

## 2021-02-22 DIAGNOSIS — E010 Iodine-deficiency related diffuse (endemic) goiter: Secondary | ICD-10-CM

## 2021-02-22 DIAGNOSIS — R718 Other abnormality of red blood cells: Secondary | ICD-10-CM

## 2021-02-22 DIAGNOSIS — G5692 Unspecified mononeuropathy of left upper limb: Secondary | ICD-10-CM | POA: Diagnosis not present

## 2021-02-22 DIAGNOSIS — R1314 Dysphagia, pharyngoesophageal phase: Secondary | ICD-10-CM

## 2021-02-22 DIAGNOSIS — K769 Liver disease, unspecified: Secondary | ICD-10-CM

## 2021-02-22 DIAGNOSIS — R1310 Dysphagia, unspecified: Secondary | ICD-10-CM | POA: Insufficient documentation

## 2021-02-22 DIAGNOSIS — R0789 Other chest pain: Secondary | ICD-10-CM | POA: Insufficient documentation

## 2021-02-22 DIAGNOSIS — R5383 Other fatigue: Secondary | ICD-10-CM

## 2021-02-22 NOTE — Patient Instructions (Signed)
I recommend cutting the dose of the metoprolol in half and taking just a half a tablet daily of the 25 mg.  I have referred you to neurology.  I have referred you for an MRI of your abdomen to further evaluate the spots seen in your liver.

## 2021-02-22 NOTE — Progress Notes (Signed)
Patient ID: Marcus Hernandez, male    DOB: 27-Sep-1986  MRN: [id number]  CC: 52-month follow-up for chronic disease management.  Subjective: Marcus Hernandez is a 35 y.o. male who presents for chronic disease management.  Girlfriend is with him His concerns today include:  Patient with history of anxiety, atypical chest pain, thyromegaly, thyroid nodule with benign biopsy, systolic CHF (followed by cardiology Dr. Gilman Schmidt), COVID-19.  Systolic CHF: saw Dr. Gilman Schmidt in follow-up last month.  He had coronary CT which came back okay.  Incidental finding was 2 small hypervascular liver lesions the largest of which was 1.4 cm in the posterior right liver dome.  Radiologist recommended MRI of the abdomen with and without IV contrast if malignancy is suspected.  He had cardiac MRI.  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  Chest pain is thought to be noncardiac.  Advised to continue Toprol 25 mg and Cozaar 12.5 mg daily. Patient reports that he is not taking Losartan.  Wants to see how his body does without it.  Saw GI Dr. Rush Landmark yesterday for dysphagia and unintentional weight loss.  His note is not completed in the system as yet.  However patient tells me that he recommended EGD/c-scope.  Pt wanted to hold off.  Prescribed PPI in the form of Nexium.  He has not picked up prescription as yet. He tells me that the problems swallowing comes and goes but has not been happening as often.    wgh 149 in Dec.  Now 143-142.  Pt states he was not eating much around Nov-Dec because of acid reflux med and Zoloft, both of which made him nauseous and not wanting to eat. Now eating 2-3 meals a day.  Does not snack between meals.  Planes of intermittent numbness in the entire LT arm x several mths.  Occurs a lot when driving and has his elbow rested on the door rest.  However it occurs at other times also.. Occurs  daily and last 30-60 mins for past mth Associated with weak feeling in his body.  No neck pain.   Patient Active Problem List   Diagnosis Date Noted  . Unintentional weight loss 02/22/2021  . Dysphagia 02/22/2021  . Atypical chest pain 02/22/2021  . Change in bowel habits 02/22/2021  . Systolic heart failure ([place]) 12/28/2020  . Thyroid nodule 12/28/2020  . Insomnia 12/28/2020  . Thyromegaly 11/01/2020  . Weight loss 11/01/2020  . Pharyngoesophageal dysphagia 11/01/2020  . SOB (shortness of breath) 11/01/2020  . Chest pain in adult 11/01/2020  . Influenza vaccine refused 10/31/2020     Current Outpatient Medications on File Prior to Visit  Medication Sig Dispense Refill  . busPIRone (BUSPAR) 5 MG tablet Take 1 tablet (5 mg total) by mouth 2 (two) times daily. (Patient not taking: Reported on 02/22/2021) 60 tablet 1  . esomeprazole (NEXIUM) 40 MG capsule Take 1 capsule (40 mg total) by mouth daily. 90 capsule 2  . hydrOXYzine (ATARAX/VISTARIL) 25 MG tablet Take 1 tablet (25 mg total) by mouth 3 (three) times daily as needed. 60 tablet 1  . losartan (COZAAR) 25 MG tablet Take 0.5 tablets (12.5 mg total) by mouth daily. 45 tablet 3  . metoprolol succinate (TOPROL XL) 25 MG 24 hr tablet Take 1 tablet (25 mg total) by mouth daily. 90 tablet 3   No current facility-administered medications on file prior to visit.    Allergies  Allergen Reactions  . Shrimp [Shellfish Allergy] Swelling    To lips    Social History   Socioeconomic History  . Marital status: Single    Spouse name: Not on file  . Number of children: 3  . Years of education: Not on file  . Highest education level: Not on file  Occupational History  . Occupation: [employer]  Tobacco Use  . Smoking status: Former Smoker    Types: Cigars  . Smokeless tobacco: Never Used  Vaping Use  . Vaping Use: Never used  Substance and Sexual Activity  . Alcohol use: Yes    Comment: weekly  . Drug use: No  . Sexual  activity: Not on file  Other Topics Concern  . Not on file  Social History Narrative  . Not on file   Social Determinants of Health   Financial Resource Strain: Not on file  Food Insecurity: Not on file  Transportation Needs: Not on file  Physical Activity: Not on file  Stress: Not on file  Social Connections: Not on file  Intimate Partner Violence: Not on file    Family History  Problem Relation Age of Onset  . Hyperthyroidism Sister   . Prostate cancer Father   . GER disease Mother   . Colon cancer Neg Hx   . Esophageal cancer Neg Hx   . Inflammatory bowel disease Neg Hx   . Liver disease Neg Hx   . Pancreatic cancer Neg Hx   . Rectal cancer Neg Hx   . Stomach cancer Neg Hx     No past surgical history on file.  ROS: Review of Systems Negative except as stated above  PHYSICAL EXAM: BP 111/68 (BP Location: Left Arm, Patient Position: Sitting, Cuff Size: Normal)   Pulse (!) 57   Resp 16   Wt 143 lb 12.8 oz (65.2 kg)   SpO2 100%   BMI 21.86 kg/m   Wt Readings from Last 3 Encounters:  02/22/21 143 lb 12.8 oz (65.2 kg)  02/21/21 143 lb (64.9 kg)  02/05/21 143 lb (64.9 kg)    Physical Exam  General appearance - alert, well appearing, young African-American male and in no distress Mental status -patient with flat affect. Neck - supple, right thyroid nodule is palpable Lymphatics - no palpable lymphadenopathy -cervical or axillary, no hepatosplenomegaly Chest - clear to auscultation, no wheezes, rales or rhonchi, symmetric air entry Heart - normal rate, regular rhythm, normal S1, S2, no murmurs, rubs, clicks or gallops Musculoskeletal -no signs of muscle atrophy in the upper arms, forearms or hands. Neurologic -grip 5/5 bilaterally.  Power in both upper extremities 5/5 proximally and distally.  Gross sensation intact in both upper extremities.  CMP Latest Ref Rng & Units 02/22/2021 01/08/2021 12/16/2020  Glucose 65 - 99 mg/dL 87 82 105(H)  BUN 6 - 20 mg/dL 12 9  10   Creatinine 0.76 - 1.27 mg/dL 1.00 1.05 1.01  Sodium 134 - 144 mmol/L 140 142 138  Potassium 3.5 - 5.2 mmol/L 4.7 4.9 3.5  Chloride 96 - 106 mmol/L 100 104 104  CO2 20 - 29 mmol/L 22 24 25   Calcium 8.7 - 10.2 mg/dL 10.2 9.8 9.5  Total Protein 6.0 - 8.5 g/dL 7.7 - -  Total Bilirubin 0.0 - 1.2 mg/dL 0.3 - -  Alkaline Phos 44 - 121 IU/L 63 - -  AST 0 - 40 IU/L 20 - -  ALT 0 - 44 IU/L 9 - -   Lipid Panel  No  results found for: CHOL, TRIG, HDL, CHOLHDL, VLDL, LDLCALC, LDLDIRECT  CBC    Component Value Date/Time   WBC 6.3 02/22/2021 1638   WBC 9.2 12/16/2020 1604   RBC 6.17 (H) 02/22/2021 1638   RBC 5.96 (H) 12/16/2020 1604   HGB 14.0 02/22/2021 1638   HCT 45.8 02/22/2021 1638   PLT 323 02/22/2021 1638   MCV 74 (L) 02/22/2021 1638   MCH 22.7 (L) 02/22/2021 1638   MCH 23.0 (L) 12/16/2020 1604   MCHC 30.6 (L) 02/22/2021 1638   MCHC 31.2 12/16/2020 1604   RDW 16.4 (H) 02/22/2021 1638   LYMPHSABS 2.0 10/20/2020 2041   MONOABS 0.9 10/20/2020 2041   EOSABS 0.0 10/20/2020 2041   BASOSABS 0.0 10/20/2020 2041    ASSESSMENT AND PLAN: 1. Unintentional weight loss Advised patient that he should get an EGD/colonoscopy just to complete work-up and make sure nothing is going on in the gastrointestinal tract that may be causing or contributing to the weight loss.  He has a follow-up appointment with the gastroenterologist next month per his report.  2. Pharyngoesophageal dysphagia Most likely due to thyroid nodule.  However, see recommendations above under #1.  3. Thyromegaly Given further weight loss, I will recheck his thyroid level to make sure he is not developing hyperthyroidism. - TSH+T4F+T3Free  4. 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  I recommend that he avoids leaning on the left elbow for extended periods of time. - Vitamin B12 - Ambulatory referral to Neurology  5. Liver lesion, right lobe Given his ongoing weight loss issues, I think it  would be wise for [place] to follow-up on this with abdominal MRI. - MR Abdomen W Wo Contrast; Future - Comprehensive metabolic panel  6. Fatigue, unspecified type Of questionable etiology. Encouraged him to eat 3 solid meals a day and also can have a snack in between meals Also a note today that his pulse is in the 50s.  I recommend cutting the Toprol 25 mg in half and taking just a half a tablet daily. - CBC  7. Microcytosis See addendum below - Iron, TIBC and Ferritin Panel  At the end of the visit, patient asked what I think may be causing his intermittent chest pains.  I told him at this point I do still think that anxiety is playing a role.  Addendum 02/23/21: pt with persistent microcytosis on CBC despite normal hemoglobin and hematocrit level.  We will add iron studies to CBC. Patient was given the opportunity to ask questions.  Patient verbalized understanding of the plan and was able to repeat key elements of the plan.   Orders Placed This Encounter  Procedures  . MR Abdomen W Wo Contrast  . TSH+T4F+T3Free  . Vitamin B12  . Comprehensive metabolic panel  . CBC  . Iron, TIBC and Ferritin Panel  . Ambulatory referral to Neurology     Requested Prescriptions    No prescriptions requested or ordered in this encounter    Return in about 4 months (around 06/24/2021).  Karle Plumber, MD, FACP

## 2021-02-23 DIAGNOSIS — K769 Liver disease, unspecified: Secondary | ICD-10-CM | POA: Insufficient documentation

## 2021-02-23 LAB — COMPREHENSIVE METABOLIC PANEL
ALT: 9 IU/L (ref 0–44)
AST: 20 IU/L (ref 0–40)
Albumin/Globulin Ratio: 1.5 (ref 1.2–2.2)
Albumin: 4.6 g/dL (ref 4.0–5.0)
Alkaline Phosphatase: 63 IU/L (ref 44–121)
BUN/Creatinine Ratio: 12 (ref 9–20)
BUN: 12 mg/dL (ref 6–20)
Bilirubin Total: 0.3 mg/dL (ref 0.0–1.2)
CO2: 22 mmol/L (ref 20–29)
Calcium: 10.2 mg/dL (ref 8.7–10.2)
Chloride: 100 mmol/L (ref 96–106)
Creatinine, Ser: 1 mg/dL (ref 0.76–1.27)
Globulin, Total: 3.1 g/dL (ref 1.5–4.5)
Glucose: 87 mg/dL (ref 65–99)
Potassium: 4.7 mmol/L (ref 3.5–5.2)
Sodium: 140 mmol/L (ref 134–144)
Total Protein: 7.7 g/dL (ref 6.0–8.5)
eGFR: 101 mL/min/{1.73_m2} (ref >59)

## 2021-02-23 LAB — CBC
Hematocrit: 45.8 % (ref 37.5–51.0)
Hemoglobin: 14 g/dL (ref 13.0–17.7)
MCH: 22.7 pg — ABNORMAL LOW (ref 26.6–33.0)
MCHC: 30.6 g/dL — ABNORMAL LOW (ref 31.5–35.7)
MCV: 74 fL — ABNORMAL LOW (ref 79–97)
Platelets: 323 10*3/uL (ref 150–450)
RBC: 6.17 x10E6/uL — ABNORMAL HIGH (ref 4.14–5.80)
RDW: 16.4 % — ABNORMAL HIGH (ref 11.6–15.4)
WBC: 6.3 10*3/uL (ref 3.4–10.8)

## 2021-02-23 LAB — TSH+T4F+T3FREE
Free T4: 1.41 ng/dL (ref 0.82–1.77)
T3, Free: 2.9 pg/mL (ref 2.0–4.4)
TSH: 1.48 u[IU]/mL (ref 0.450–4.500)

## 2021-02-23 LAB — VITAMIN B12: Vitamin B-12: 1242 pg/mL (ref 232–1245)

## 2021-02-25 LAB — IRON,TIBC AND FERRITIN PANEL
Ferritin: 316 ng/mL (ref 30–400)
Iron Saturation: 27 % (ref 15–55)
Iron: 73 ug/dL (ref 38–169)
Total Iron Binding Capacity: 270 ug/dL (ref 250–450)
UIBC: 197 ug/dL (ref 111–343)

## 2021-02-25 LAB — SPECIMEN STATUS REPORT

## 2021-02-28 ENCOUNTER — Encounter: Payer: Self-pay | Admitting: Gastroenterology

## 2021-02-28 DIAGNOSIS — R630 Anorexia: Secondary | ICD-10-CM | POA: Insufficient documentation

## 2021-03-06 ENCOUNTER — Ambulatory Visit (HOSPITAL_COMMUNITY): Payer: 59

## 2021-03-15 ENCOUNTER — Other Ambulatory Visit: Payer: Self-pay

## 2021-03-15 ENCOUNTER — Ambulatory Visit (HOSPITAL_COMMUNITY)
Admission: RE | Admit: 2021-03-15 | Discharge: 2021-03-15 | Disposition: A | Discharge status: Home / Self Care | Payer: 59 | Source: Ambulatory Visit | Attending: Internal Medicine | Admitting: Internal Medicine

## 2021-03-15 DIAGNOSIS — K769 Liver disease, unspecified: Secondary | ICD-10-CM | POA: Diagnosis present

## 2021-03-15 MED ORDER — GADOXETATE DISODIUM 0.25 MMOL/ML IV SOLN
6.0000 mL | Freq: Once | INTRAVENOUS | Status: AC | PRN
  Administered 2021-03-15: 6 mL via INTRAVENOUS

## 2021-04-06 ENCOUNTER — Ambulatory Visit: Payer: 59 | Admitting: Gastroenterology

## 2021-05-03 ENCOUNTER — Other Ambulatory Visit: Payer: Self-pay | Admitting: Gastroenterology

## 2021-05-04 ENCOUNTER — Ambulatory Visit: Payer: 59 | Admitting: Gastroenterology

## 2021-05-21 ENCOUNTER — Ambulatory Visit: Payer: 59 | Admitting: Neurology

## 2021-06-04 ENCOUNTER — Ambulatory Visit: Payer: 59 | Admitting: Neurology

## 2021-06-05 ENCOUNTER — Ambulatory Visit (INDEPENDENT_AMBULATORY_CARE_PROVIDER_SITE_OTHER): Payer: 59 | Admitting: Neurology

## 2021-06-05 ENCOUNTER — Encounter: Payer: Self-pay | Admitting: Neurology

## 2021-06-05 DIAGNOSIS — R202 Paresthesia of skin: Secondary | ICD-10-CM

## 2021-06-05 NOTE — Progress Notes (Signed)
Reason for visit: Left arm numbness  Referring physician: Dr. Arnold Long Marcus Hernandez is a 35 y.o. male  History of present illness:  Marcus Hernandez is a 35 year old right-handed black male with a history of a recent unintentional 30 pound weight loss that occurred in the fall 2021.  Around this time, he was found to have some issues with congestive heart failure with ejection fraction of 40 to 45%.  No evidence of coronary artery disease was noted.  He is followed through cardiology, he was placed on a beta-blocker for episodes of increased heart rate.  The patient began having episodes around this time of numbness of the left arm that would come and go lasting about an hour or 2.  He was seen by his primary care physician in March 2022 and referred to this office.  Since that time however, he has had full resolution of these symptoms.  He reports that he has a "sick feeling" in his stomach and chest around the time of onset of the left arm numbness, he still gets these feelings but the numbness in the arm never occurs at this point.  When the arm would get numb, no other areas were affected, he would not have any facial or leg numbness.  He had no weakness or clumsiness of the arm.  He denies any balance issues or difficulty controlling the bowels or the bladder.  His weight has stabilized over the last 6 months.  He has episodes daily at this point of migratory pulsation or tingling sensations in his abdomen or chest on the right side or left side that will come and go over a minute or 2.  He is sent to this office for further evaluation.  Past Medical History:  Diagnosis Date   Axillary abscess    CHF (congestive heart failure) ([place])     History reviewed. No pertinent surgical history.  Family History  Problem Relation Age of Onset   GER disease Mother    Diabetes Mother    Diabetes Father    Prostate cancer Father    Hyperthyroidism Sister    Colon cancer Neg Hx    Esophageal  cancer Neg Hx    Inflammatory bowel disease Neg Hx    Liver disease Neg Hx    Pancreatic cancer Neg Hx    Rectal cancer Neg Hx    Stomach cancer Neg Hx     Social history:  reports that he has quit smoking. His smoking use included cigars. He has never used smokeless tobacco. He reports current alcohol use. He reports that he does not use drugs.  Medications:  Prior to Admission medications   Medication Sig Start Date End Date Taking? Authorizing Provider  busPIRone (BUSPAR) 5 MG tablet TAKE 1 TABLET (5 MG TOTAL) BY MOUTH 2 (TWO) TIMES DAILY. 12/02/20 12/02/21 Yes Ladell Pier, MD  hydrOXYzine (ATARAX/VISTARIL) 25 MG tablet Take 1 tablet (25 mg total) by mouth 3 (three) times daily as needed. 12/13/20  Yes Charlott Rakes, MD  losartan (COZAAR) 25 MG tablet Take 0.5 tablets (12.5 mg total) by mouth daily. 12/28/20 12/23/21 Yes Donato Heinz, MD  metoprolol succinate (TOPROL XL) 25 MG 24 hr tablet Take 1 tablet (25 mg total) by mouth daily. 12/14/20  Yes Donato Heinz, MD  omeprazole (PRILOSEC) 40 MG capsule TAKE 1 CAPSULE (40 MG TOTAL) BY MOUTH DAILY. 05/03/21  Yes Mansouraty, Telford Nab., MD  sertraline (ZOLOFT) 50 MG tablet 1/2 tab daily x  3 wks then 1 tab PO daily 10/31/20 12/02/20  Ladell Pier, MD      Allergies  Allergen Reactions   Shrimp [Shellfish Allergy] Swelling    To lips    ROS:  Out of a complete 14 system review of symptoms, the patient complains only of the following symptoms, and all other reviewed systems are negative.  Numbness Chest pain  Blood pressure 106/66, pulse 77, height 5\' 7"  (1.702 m), weight 146 lb (66.2 kg).  Physical Exam  General: The patient is alert and cooperative at the time of the examination.  Eyes: Pupils are equal, round, and reactive to light. Discs are flat bilaterally.  Neck: The neck is supple, no carotid bruits are noted.  Respiratory: The respiratory examination is clear.  Cardiovascular: The  cardiovascular examination reveals a regular rate and rhythm, no obvious murmurs or rubs are noted.  Skin: Extremities are without significant edema.  Neurologic Exam  Mental status: The patient is alert and oriented x 3 at the time of the examination. The patient has apparent normal recent and remote memory, with an apparently normal attention span and concentration ability.  Cranial nerves: Facial symmetry is present. There is good sensation of the face to pinprick and soft touch bilaterally. The strength of the facial muscles and the muscles to head turning and shoulder shrug are normal bilaterally. Speech is well enunciated, no aphasia or dysarthria is noted. Extraocular movements are full. Visual fields are full. The tongue is midline, and the patient has symmetric elevation of the soft palate. No obvious hearing deficits are noted.  Motor: The motor testing reveals 5 over 5 strength of all 4 extremities. Good symmetric motor tone is noted throughout.  Sensory: Sensory testing is intact to pinprick, soft touch, vibration sensation, and position sense on all 4 extremities. No evidence of extinction is noted.  Coordination: Cerebellar testing reveals good finger-nose-finger and heel-to-shin bilaterally.  Gait and station: Gait is normal. Tandem gait is normal. Romberg is negative. No drift is seen.  Reflexes: Deep tendon reflexes are symmetric and normal bilaterally. Toes are downgoing bilaterally.   Assessment/Plan:  1.  Episodic left arm numbness, resolved  2.  Congestive heart failure, etiology unclear  The patient currently is not having any symptoms involving the left arm.  His clinical examination is completely normal at this time.  I will watch the patient conservatively, if he has any recurrence of symptoms or develops new symptoms he is to contact our office.  Jill Alexanders MD 06/05/2021 11:14 AM  [facility] [address] [place],  [place] [address]  Phone [phone] Fax [phone]

## 2021-06-25 ENCOUNTER — Encounter: Payer: Self-pay | Admitting: Internal Medicine

## 2021-06-25 ENCOUNTER — Other Ambulatory Visit: Payer: Self-pay

## 2021-06-25 ENCOUNTER — Ambulatory Visit: Payer: 59 | Attending: Internal Medicine | Admitting: Internal Medicine

## 2021-06-25 VITALS — BP 104/67 | HR 65 | Temp 98.3°F | Resp 16 | Wt 146.0 lb

## 2021-06-25 DIAGNOSIS — K219 Gastro-esophageal reflux disease without esophagitis: Secondary | ICD-10-CM | POA: Diagnosis not present

## 2021-06-25 DIAGNOSIS — R0789 Other chest pain: Secondary | ICD-10-CM | POA: Diagnosis not present

## 2021-06-25 NOTE — Patient Instructions (Signed)
Gastroesophageal Reflux Disease, Adult  Gastroesophageal reflux (GER) happens when acid from the stomach flows up into the tube that connects the mouth and the stomach (esophagus). Normally, food travels down the esophagus and stays in the stomach to be digested. With GER, food and stomach acid sometimes move back up into theesophagus. You may have a disease called gastroesophageal reflux disease (GERD) if the reflux: Happens often. Causes frequent or very bad symptoms. Causes problems such as damage to the esophagus. When this happens, the esophagus becomes sore and swollen. Over time, GERD can make small holes (ulcers) in the lining of the esophagus. What are the causes? This condition is caused by a problem with the muscle between the esophagus and the stomach. When this muscle is weak or not normal, it does not close properlyto keep food and acid from coming back up from the stomach. The muscle can be weak because of: Tobacco use. Pregnancy. Having a certain type of hernia (hiatal hernia). Alcohol use. Certain foods and drinks, such as coffee, chocolate, onions, and peppermint. What increases the risk? Being overweight. Having a disease that affects your connective tissue. Taking NSAIDs, such a ibuprofen. What are the signs or symptoms? Heartburn. Difficult or painful swallowing. The feeling of having a lump in the throat. A bitter taste in the mouth. Bad breath. Having a lot of saliva. Having an upset or bloated stomach. Burping. Chest pain. Different conditions can cause chest pain. Make sure you see your doctor if you have chest pain. Shortness of breath or wheezing. A long-term cough or a cough at night. Wearing away of the surface of teeth (tooth enamel). Weight loss. How is this treated? Making changes to your diet. Taking medicine. Having surgery. Treatment will depend on how bad your symptoms are. Follow these instructions at home: Eating and drinking  Follow a  diet as told by your doctor. You may need to avoid foods and drinks such as: Coffee and tea, with or without caffeine. Drinks that contain alcohol. Energy drinks and sports drinks. Bubbly (carbonated) drinks or sodas. Chocolate and cocoa. Peppermint and mint flavorings. Garlic and onions. Horseradish. Spicy and acidic foods. These include peppers, chili powder, curry powder, vinegar, hot sauces, and BBQ sauce. Citrus fruit juices and citrus fruits, such as oranges, lemons, and limes. Tomato-based foods. These include red sauce, chili, salsa, and pizza with red sauce. Fried and fatty foods. These include donuts, french fries, potato chips, and high-fat dressings. High-fat meats. These include hot dogs, rib eye steak, sausage, ham, and bacon. High-fat dairy items, such as whole milk, butter, and cream cheese. Eat small meals often. Avoid eating large meals. Avoid drinking large amounts of liquid with your meals. Avoid eating meals during the 2-3 hours before bedtime. Avoid lying down right after you eat. Do not exercise right after you eat.  Lifestyle  Do not smoke or use any products that contain nicotine or tobacco. If you need help quitting, ask your doctor. Try to lower your stress. If you need help doing this, ask your doctor. If you are overweight, lose an amount of weight that is healthy for you. Ask your doctor about a safe weight loss goal.  General instructions Pay attention to any changes in your symptoms. Take over-the-counter and prescription medicines only as told by your doctor. Do not take aspirin, ibuprofen, or other NSAIDs unless your doctor says it is okay. Wear loose clothes. Do not wear anything tight around your waist. Raise (elevate) the head of your bed about  6 inches (15 cm). You may need to use a wedge to do this. Avoid bending over if this makes your symptoms worse. Keep all follow-up visits. Contact a doctor if: You have new symptoms. You lose weight and  you do not know why. You have trouble swallowing or it hurts to swallow. You have wheezing or a cough that keeps happening. You have a hoarse voice. Your symptoms do not get better with treatment. Get help right away if: You have sudden pain in your arms, neck, jaw, teeth, or back. You suddenly feel sweaty, dizzy, or light-headed. You have chest pain or shortness of breath. You vomit and the vomit is green, yellow, or black, or it looks like blood or coffee grounds. You faint. Your poop (stool) is red, bloody, or black. You cannot swallow, drink, or eat. These symptoms may represent a serious problem that is an emergency. Do not wait to see if the symptoms will go away. Get medical help right away. Call your local emergency services (911 in the [place] Do not drive yourself to the hospital. Summary If a person has gastroesophageal reflux disease (GERD), food and stomach acid move back up into the esophagus and cause symptoms or problems such as damage to the esophagus. Treatment will depend on how bad your symptoms are. Follow a diet as told by your doctor. Take all medicines only as told by your doctor. This information is not intended to replace advice given to you by your health care provider. Make sure you discuss any questions you have with your healthcare provider. Document Revised: 06/12/2020 Document Reviewed: 06/12/2020 Elsevier Patient Education  [employer].

## 2021-06-25 NOTE — Progress Notes (Signed)
Patient ID: Marcus Hernandez, male    DOB: 1986/05/13  MRN: [id number]  CC: Chest Pain   Subjective: Marcus Hernandez is a 35 y.o. male who presents for chronic ds management. His GF is with him. His concerns today include:  Patient with history of anxiety, atypical chest pain, thyromegaly, thyroid nodule with benign biopsy, systolic CHF (followed by cardiology Dr. Gilman Schmidt), COVID-19.  Atypical CP:  never filled the rxn for PPI Omeprazole.  Still gets CP.  Reports burning in the chest when he eats certain foods that are highly seasoned like potato chips and fries.  Also reports some chest pains with heavy lifting.  The pain comes on about 30 minutes to 1 hour after lifting. Sill taking Metoprolol 25 mg 1/2 tab a day.  Not taking the Cozaar.  He has follow-up appointment with cardiologist next month.   His weight has remained stable.  As a matter fact he has gained 3 pounds since last visit.  He noticed a diagnosis of anorexia on his chart.  He wanted to know where that diagnosis came from. Noticed some muscle spasms RUE for 2 days but not today   Patient Active Problem List   Diagnosis Date Noted   Paresthesia 06/05/2021   Anorexia 02/28/2021   Liver lesion, right lobe 02/23/2021   Unintentional weight loss 02/22/2021   Dysphagia 02/22/2021   Atypical chest pain 02/22/2021   Change in bowel habits 78/58/8502   Systolic heart failure ([place]) 12/28/2020   Thyroid nodule 12/28/2020   Insomnia 12/28/2020   Thyromegaly 11/01/2020   Weight loss 11/01/2020   Pharyngoesophageal dysphagia 11/01/2020   SOB (shortness of breath) 11/01/2020   Chest pain in adult 11/01/2020   Influenza vaccine refused 10/31/2020     Current Outpatient Medications on File Prior to Visit  Medication Sig Dispense Refill   metoprolol succinate (TOPROL XL) 25 MG 24 hr tablet Take 1 tablet (25 mg total) by mouth daily. 90 tablet 3   [DISCONTINUED] sertraline (ZOLOFT) 50 MG tablet 1/2 tab daily x 3 wks then 1 tab  PO daily 30 tablet 2   No current facility-administered medications on file prior to visit.    Allergies  Allergen Reactions   Shrimp [Shellfish Allergy] Swelling    To lips    Social History   Socioeconomic History   Marital status: Single    Spouse name: Not on file   Number of children: 3   Years of education: Not on file   Highest education level: Not on file  Occupational History   Occupation: [employer]  Tobacco Use   Smoking status: Former    Pack years: 0.00    Types: Cigars   Smokeless tobacco: Never  Vaping Use   Vaping Use: Never used  Substance and Sexual Activity   Alcohol use: Yes    Comment: weekly   Drug use: No   Sexual activity: Not on file  Other Topics Concern   Not on file  Social History Narrative   Lives with mom and sister   Right Handed   Drinks no caffeine   Social Determinants of Health   Financial Resource Strain: Not on file  Food Insecurity: Not on file  Transportation Needs: Not on file  Physical Activity: Not on file  Stress: Not on file  Social Connections: Not on file  Intimate Partner Violence: Not on file    Family History  Problem Relation Age of Onset   GER disease Mother  Diabetes Mother    Diabetes Father    Prostate cancer Father    Hyperthyroidism Sister    Colon cancer Neg Hx    Esophageal cancer Neg Hx    Inflammatory bowel disease Neg Hx    Liver disease Neg Hx    Pancreatic cancer Neg Hx    Rectal cancer Neg Hx    Stomach cancer Neg Hx     No past surgical history on file.  ROS: Review of Systems Negative except as stated above  PHYSICAL EXAM: BP 104/67 (BP Location: Right Arm, Patient Position: Sitting, Cuff Size: Normal)   Pulse 65   Temp 98.3 F (36.8 C)   Resp 16   Wt 146 lb (66.2 kg)   SpO2 99%   BMI 22.87 kg/m   Wt Readings from Last 3 Encounters:  06/25/21 146 lb (66.2 kg)  06/05/21 146 lb (66.2 kg)  02/22/21 143 lb 12.8 oz (65.2 kg)    Physical Exam General appearance -  alert, well appearing, young African-American male and in no distress Mental status - normal mood, behavior, speech, dress, motor activity, and thought processes Neck - supple, no significant adenopathy Chest - clear to auscultation, no wheezes, rales or rhonchi, symmetric air entry Heart - normal rate, regular rhythm, normal S1, S2, no murmurs, rubs, clicks or gallops  CMP Latest Ref Rng & Units 02/22/2021 01/08/2021 12/16/2020  Glucose 65 - 99 mg/dL 87 82 105(H)  BUN 6 - 20 mg/dL 12 9 10   Creatinine 0.76 - 1.27 mg/dL 1.00 1.05 1.01  Sodium 134 - 144 mmol/L 140 142 138  Potassium 3.5 - 5.2 mmol/L 4.7 4.9 3.5  Chloride 96 - 106 mmol/L 100 104 104  CO2 20 - 29 mmol/L 22 24 25   Calcium 8.7 - 10.2 mg/dL 10.2 9.8 9.5  Total Protein 6.0 - 8.5 g/dL 7.7 - -  Total Bilirubin 0.0 - 1.2 mg/dL 0.3 - -  Alkaline Phos 44 - 121 IU/L 63 - -  AST 0 - 40 IU/L 20 - -  ALT 0 - 44 IU/L 9 - -   Lipid Panel  No results found for: CHOL, TRIG, HDL, CHOLHDL, VLDL, LDLCALC, LDLDIRECT  CBC    Component Value Date/Time   WBC 6.3 02/22/2021 1638   WBC 9.2 12/16/2020 1604   RBC 6.17 (H) 02/22/2021 1638   RBC 5.96 (H) 12/16/2020 1604   HGB 14.0 02/22/2021 1638   HCT 45.8 02/22/2021 1638   PLT 323 02/22/2021 1638   MCV 74 (L) 02/22/2021 1638   MCH 22.7 (L) 02/22/2021 1638   MCH 23.0 (L) 12/16/2020 1604   MCHC 30.6 (L) 02/22/2021 1638   MCHC 31.2 12/16/2020 1604   RDW 16.4 (H) 02/22/2021 1638   LYMPHSABS 2.0 10/20/2020 2041   MONOABS 0.9 10/20/2020 2041   EOSABS 0.0 10/20/2020 2041   BASOSABS 0.0 10/20/2020 2041    ASSESSMENT AND PLAN: 1. GERD without esophagitis Encourage patient to take the omeprazole.  He states that he has some at home. GERD precautions discussed.  Advised to avoid certain foods like spicy foods, tomato-based foods, juices and excessive caffeine.  Advised to eat his last meal at least 2 to 3 hours before laying down at nights and to sleep with his head slightly elevated.   -Anorexia  diagnosis included as one of the diagnosis from the gastroenterologist.  Inform patient that it was probably placed there given the fact that he was not eating well and had problems swallowing and was losing weight.  Now that his weight is stabilized, we can remove that diagnosis from his active problem list.  2. Atypical chest pain Likely due to #1   Patient was given the opportunity to ask questions.  Patient verbalized understanding of the plan and was able to repeat key elements of the plan.   No orders of the defined types were placed in this encounter.    Requested Prescriptions    No prescriptions requested or ordered in this encounter    No follow-ups on file.  Karle Plumber, MD, FACP

## 2021-06-25 NOTE — Progress Notes (Signed)
F/u-  Chest pain- 5/10  Onset since Dec. Intermittent

## 2021-07-18 ENCOUNTER — Ambulatory Visit: Payer: Self-pay | Admitting: Cardiology

## 2021-07-18 NOTE — Progress Notes (Signed)
Cardiology Office Note:    Date:  07/19/2021   ID:  ZIMRI BRENNEN, DOB Apr 21, 1986, MRN [id number]  PCP:  Ladell Pier, MD  Cardiologist:  None  Electrophysiologist:  None   Referring MD: Ladell Pier, MD   No chief complaint on file.  History of Present Illness:    Marcus Hernandez is a 35 y.o. male with a hx of COVID-19 infection 07/2020 who presents for follow-up.  He was referred by Dr. Wynetta Emery for evaluation of chest pain, initially seen on 11/16/2020.  He reports that his symptoms started in October 2021.  Describes daily chest pain that occurs at different spots on the chest.  Sometimes can be left-sided, sometimes in the center, and sometimes right-sided pain.  Reports pain is either sharp or tingling sensation.  Typically just lasts for few seconds and resolves.  Sometimes the tingling sensation can last up to 20 minutes.  He does not note anything that causes it.  Does not exercise regularly but has not noted a relationship with exertion.  States that pain occurs at rest.  Also reports that he has been having shortness of breath.  Reports having lightheadedness but denies any syncope.  No lower extremity edema.  Also having palpitations that occur multiple times daily.  Palpitations started the last 2 to 3 weeks.  Occurs at rest.  States that it feels like his heart is racing during the episodes, will last for minutes.  Went to ED on 11/30 for the symptoms.  Troponins were negative.  Was prescribed metoprolol for his palpitations but has not started taking yet.  He smoked cigarettes for 2 to 3 years, 3 to 4 cigarettes/day.  Also smoked cigars for 2 to 3 years, quit in 2014.  No history of heart disease in his immediate family.  Recently found a thyroid nodules, biopsy is being planned.  Normal TSH/free T4 on 11/30.  Echocardiogram on 12/11/2020 showed LVEF 40 to 45%, global hypokinesis more pronounced in apical segment, normal RV function, no significant valvular disease.  Zio  patch x7 days showed no abnormalities.  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  Coronary CTA on 01/26/2021 showed calcium score 0, significant respiratory motion artifact but no CAD seen.  Since last clinic visit, he reports he has been doing okay.  He continues to have chest pain multiple times per week.  Reports chest pain throughout his chest and usually occurs with exertion.  When he is having the pain he does notice it is worse with deep breathing.  Reports occasional lightheadedness, denies any syncope.  Denies any shortness of breath.  Reports his palpitations have improved.  BP Readings from Last 3 Encounters:  07/19/21 100/70  06/25/21 104/67  06/05/21 106/66     Past Medical History:  Diagnosis Date   Axillary abscess    CHF (congestive heart failure) (HCC)     No past surgical history on file.  Current Medications: Current Meds  Medication Sig   losartan (COZAAR) 25 MG tablet Take 0.5 tablets (12.5 mg total) by mouth daily.   [DISCONTINUED] metoprolol succinate (TOPROL XL) 25 MG 24 hr tablet Take 1 tablet (25 mg total) by mouth daily.     Allergies:   Shrimp [shellfish allergy]   Social History   Socioeconomic History  Marital status: Single    Spouse name: Not on file   Number of children: 3   Years of education: Not on file   Highest education level: Not on file  Occupational History   Occupation: [employer]  Tobacco Use   Smoking status: Former    Types: Cigars   Smokeless tobacco: Never  Vaping Use   Vaping Use: Never used  Substance and Sexual Activity   Alcohol use: Yes    Comment: weekly   Drug use: No   Sexual activity: Not on file  Other Topics Concern   Not on file  Social History Narrative   Lives with mom and sister    Right Handed   Drinks no caffeine   Social Determinants of Health   Financial Resource Strain: Not on file  Food Insecurity: Not on file  Transportation Needs: Not on file  Physical Activity: Not on file  Stress: Not on file  Social Connections: Not on file     Family History: No known history of heart disease in his immediate family  ROS:   Please see the history of present illness.     All other systems reviewed and are negative.  EKGs/Labs/Other Studies Reviewed:    The following studies were reviewed today:   EKG:  EKG is ordered today.  The ekg ordered today demonstrates normal sinus rhythm, rate 60  Recent Labs: 02/22/2021: ALT 9; BUN 12; Creatinine, Ser 1.00; Hemoglobin 14.0; Platelets 323; Potassium 4.7; Sodium 140; TSH 1.480  Recent Lipid Panel No results found for: CHOL, TRIG, HDL, CHOLHDL, VLDL, LDLCALC, LDLDIRECT  Physical Exam:    VS:  BP 100/70 (BP Location: Left Arm, Patient Position: Sitting, Cuff Size: Normal)   Pulse 60   Ht _0  (1.702 m)   Wt 146 lb 6.4 oz (66.4 kg)   SpO2 94%   BMI 22.93 kg/m     Wt Readings from Last 3 Encounters:  07/19/21 146 lb 6.4 oz (66.4 kg)  06/25/21 146 lb (66.2 kg)  06/05/21 146 lb (66.2 kg)     GEN: Well nourished, well developed in no acute distress HEENT: Normal NECK: No JVD; No carotid bruits CARDIAC: RRR, no murmurs, rubs, gallops RESPIRATORY:  Clear to auscultation without rales, wheezing or rhonchi  ABDOMEN: Soft, non-tender, non-distended MUSCULOSKELETAL:  No edema; No deformity  SKIN: Warm and dry NEUROLOGIC:  Alert and oriented x 3 PSYCHIATRIC:  Normal affect   ASSESSMENT:    1. Medication management   2. Chronic combined systolic and diastolic heart failure (HCC)   3. Chest pain of uncertain etiology   4. Palpitations     PLAN:    Chronic combined systolic and diastolic heart failure: reported chest pain/dyspnea.  Echocardiogram on 12/11/2020 showed LVEF 40 to 45%, global hypokinesis more  pronounced in apical segments, normal RV function, no significant valvular disease.  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  Coronary CTA on 01/26/2021 showed calcium score 0, significant respiratory motion artifact but no CAD seen.  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  Will check ESR/CRP.  Suspect noncardiac chest pain, recommend PCP follow-up for  further evaluation  Palpitations: Zio patch x7 days showed no abnormalities.  Reports has significantly improved since starting on metoprolol, would continue   RTC in 6 months  Medication Adjustments/Labs and Tests Ordered: Current medicines are reviewed at length with the patient today.  Concerns regarding medicines are outlined above.  Orders Placed This Encounter  Procedures   Sed Rate (ESR)   C-reactive protein   Basic metabolic panel   EKG 50-ZTAE    Meds ordered this encounter  Medications   metoprolol succinate (TOPROL XL) 25 MG 24 hr tablet    Sig: Take 1 tablet (25 mg total) by mouth daily.    Dispense:  90 tablet    Refill:  3   losartan (COZAAR) 25 MG tablet    Sig: Take 0.5 tablets (12.5 mg total) by mouth daily.    Dispense:  45 tablet    Refill:  3     Patient Instructions  Medication Instructions:  START: LOSARTAN 12.66m DAILY  *If you need a refill on your cardiac medications before your next appointment, please call your pharmacy*  Lab Work: ESR, CRP, BMET- TODAY  If you have labs (blood work) drawn today and your tests are completely normal, you will receive your results only  by: [place](if you have MyChart) OR A paper copy in the mail If you have any lab test that is abnormal or we need to change your treatment, we will call you to review the results.  Follow-Up: [facility] and [facility] are our [facility].  As part of our continuing mission to provide you with exceptional heart care, we have created designated Provider Care Teams.  These Care Teams include your primary Cardiologist (physician) and Advanced Practice Providers (APPs -  Physician Assistants and Nurse Practitioners) who all work together to provide you with the care you need, when you need it.  Your next appointment:   6 month(s)  The format for your next appointment:   In Person  Provider:   COswaldo Milian MD   Signed, CDonato Heinz MD  07/19/2021 6:16 PM    [place]

## 2021-07-19 ENCOUNTER — Other Ambulatory Visit: Payer: Self-pay

## 2021-07-19 ENCOUNTER — Encounter: Payer: Self-pay | Admitting: Cardiology

## 2021-07-19 ENCOUNTER — Ambulatory Visit (INDEPENDENT_AMBULATORY_CARE_PROVIDER_SITE_OTHER): Payer: 59 | Admitting: Cardiology

## 2021-07-19 VITALS — BP 100/70 | HR 60 | Ht 67.0 in | Wt 146.4 lb

## 2021-07-19 DIAGNOSIS — I5042 Chronic combined systolic (congestive) and diastolic (congestive) heart failure: Secondary | ICD-10-CM | POA: Diagnosis not present

## 2021-07-19 DIAGNOSIS — R002 Palpitations: Secondary | ICD-10-CM

## 2021-07-19 DIAGNOSIS — R079 Chest pain, unspecified: Secondary | ICD-10-CM | POA: Diagnosis not present

## 2021-07-19 DIAGNOSIS — Z79899 Other long term (current) drug therapy: Secondary | ICD-10-CM

## 2021-07-19 MED ORDER — METOPROLOL SUCCINATE ER 25 MG PO TB24: 25.0000 mg | ORAL_TABLET | Freq: Every day | ORAL | 3 refills | Status: DC

## 2021-07-19 MED ORDER — LOSARTAN POTASSIUM 25 MG PO TABS: 12.5000 mg | ORAL_TABLET | Freq: Every day | ORAL | 3 refills | Status: DC

## 2021-07-19 NOTE — Patient Instructions (Addendum)
Medication Instructions:  START: LOSARTAN 12.85m DAILY  *If you need a refill on your cardiac medications before your next appointment, please call your pharmacy*  Lab Work: ESR, CRP, BMET- TODAY  If you have labs (blood work) drawn today and your tests are completely normal, you will receive your results only by: [place](if you have MyChart) OR A paper copy in the mail If you have any lab test that is abnormal or we need to change your treatment, we will call you to review the results.  Follow-Up: [facility] you and your health needs are our priority.  As part of our continuing mission to provide you with exceptional heart care, we have created designated Provider Care Teams.  These Care Teams include your primary Cardiologist (physician) and Advanced Practice Providers (APPs -  Physician Assistants and Nurse Practitioners) who all work together to provide you with the care you need, when you need it.  Your next appointment:   6 month(s)  The format for your next appointment:   In Person  Provider:   COswaldo Milian MD

## 2021-07-20 LAB — BASIC METABOLIC PANEL
BUN/Creatinine Ratio: 8 — ABNORMAL LOW (ref 9–20)
BUN: 8 mg/dL (ref 6–20)
CO2: 25 mmol/L (ref 20–29)
Calcium: 9.8 mg/dL (ref 8.7–10.2)
Chloride: 102 mmol/L (ref 96–106)
Creatinine, Ser: 1.01 mg/dL (ref 0.76–1.27)
Glucose: 83 mg/dL (ref 65–99)
Potassium: 4.7 mmol/L (ref 3.5–5.2)
Sodium: 141 mmol/L (ref 134–144)
eGFR: 99 mL/min/{1.73_m2} (ref >59)

## 2021-07-20 LAB — C-REACTIVE PROTEIN: CRP: <1 mg/L (ref 0–10)

## 2021-07-20 LAB — SEDIMENTATION RATE: Sed Rate: 3 mm/hr (ref 0–15)

## 2021-07-24 ENCOUNTER — Encounter: Payer: Self-pay | Admitting: *Deleted

## 2022-01-11 ENCOUNTER — Telehealth: Payer: Self-pay | Admitting: Cardiology

## 2022-01-11 ENCOUNTER — Encounter: Payer: Self-pay | Admitting: Internal Medicine

## 2022-01-11 MED ORDER — METOPROLOL SUCCINATE ER 25 MG PO TB24: 25.0000 mg | ORAL_TABLET | Freq: Every day | ORAL | 3 refills | Status: DC

## 2022-01-11 MED ORDER — LOSARTAN POTASSIUM 25 MG PO TABS: 12.5000 mg | ORAL_TABLET | Freq: Every day | ORAL | 3 refills | Status: DC

## 2022-01-11 NOTE — Telephone Encounter (Signed)
°*  STAT* If patient is at the pharmacy, call can be transferred to refill team.   1. Which medications need to be refilled? (please list name of each medication and dose if known) Losartan and Metoprolol- new prescription for  new pharmacyt  2. Which pharmacy/location (including street and city if [employer]  and [employer], [place]  3. Do they need a 30 day or 90 day supply? 90 days and refills

## 2022-04-08 ENCOUNTER — Telehealth: Payer: Self-pay | Admitting: Cardiology

## 2022-04-08 NOTE — Telephone Encounter (Signed)
?*  STAT* If patient is at the pharmacy, call can be transferred to refill team. ? ? ?1. Which medications need to be refilled? (please list name of each medication and dose if known)  ?metoprolol succinate (TOPROL XL) 25 MG 24 hr tablet  ? ?2. Which pharmacy/location (including street and city if local pharmacy) is medication to be sent to? ?[place], [place] AT [place] ? ?3. Do they need a 30 day or 90 day supply? 30 ? ?Patient is out of town and needs to pick up a refill at a different location  ?

## 2022-04-10 ENCOUNTER — Other Ambulatory Visit: Payer: Self-pay

## 2022-04-10 MED ORDER — METOPROLOL SUCCINATE ER 25 MG PO TB24: 25.0000 mg | ORAL_TABLET | Freq: Every day | ORAL | 3 refills | Status: DC

## 2022-04-10 NOTE — Telephone Encounter (Signed)
Medication refilled and sent to desired pharmacy. Called pt but was unable to leave a message. ?

## 2022-10-28 ENCOUNTER — Encounter: Payer: Self-pay | Admitting: Internal Medicine

## 2022-10-29 ENCOUNTER — Other Ambulatory Visit: Payer: Self-pay | Admitting: Internal Medicine

## 2022-10-29 MED ORDER — METOPROLOL SUCCINATE ER 25 MG PO TB24: 12.5000 mg | ORAL_TABLET | Freq: Every day | ORAL | 0 refills | Status: DC

## 2022-11-17 IMAGING — CR DG CHEST 2V
2 series · 2 of 2 positions shown · non-contrast
Comparison: 10/20/2020

CLINICAL DATA: Chest pain.

EXAM:
CHEST - 2 VIEW

[w chest pa]
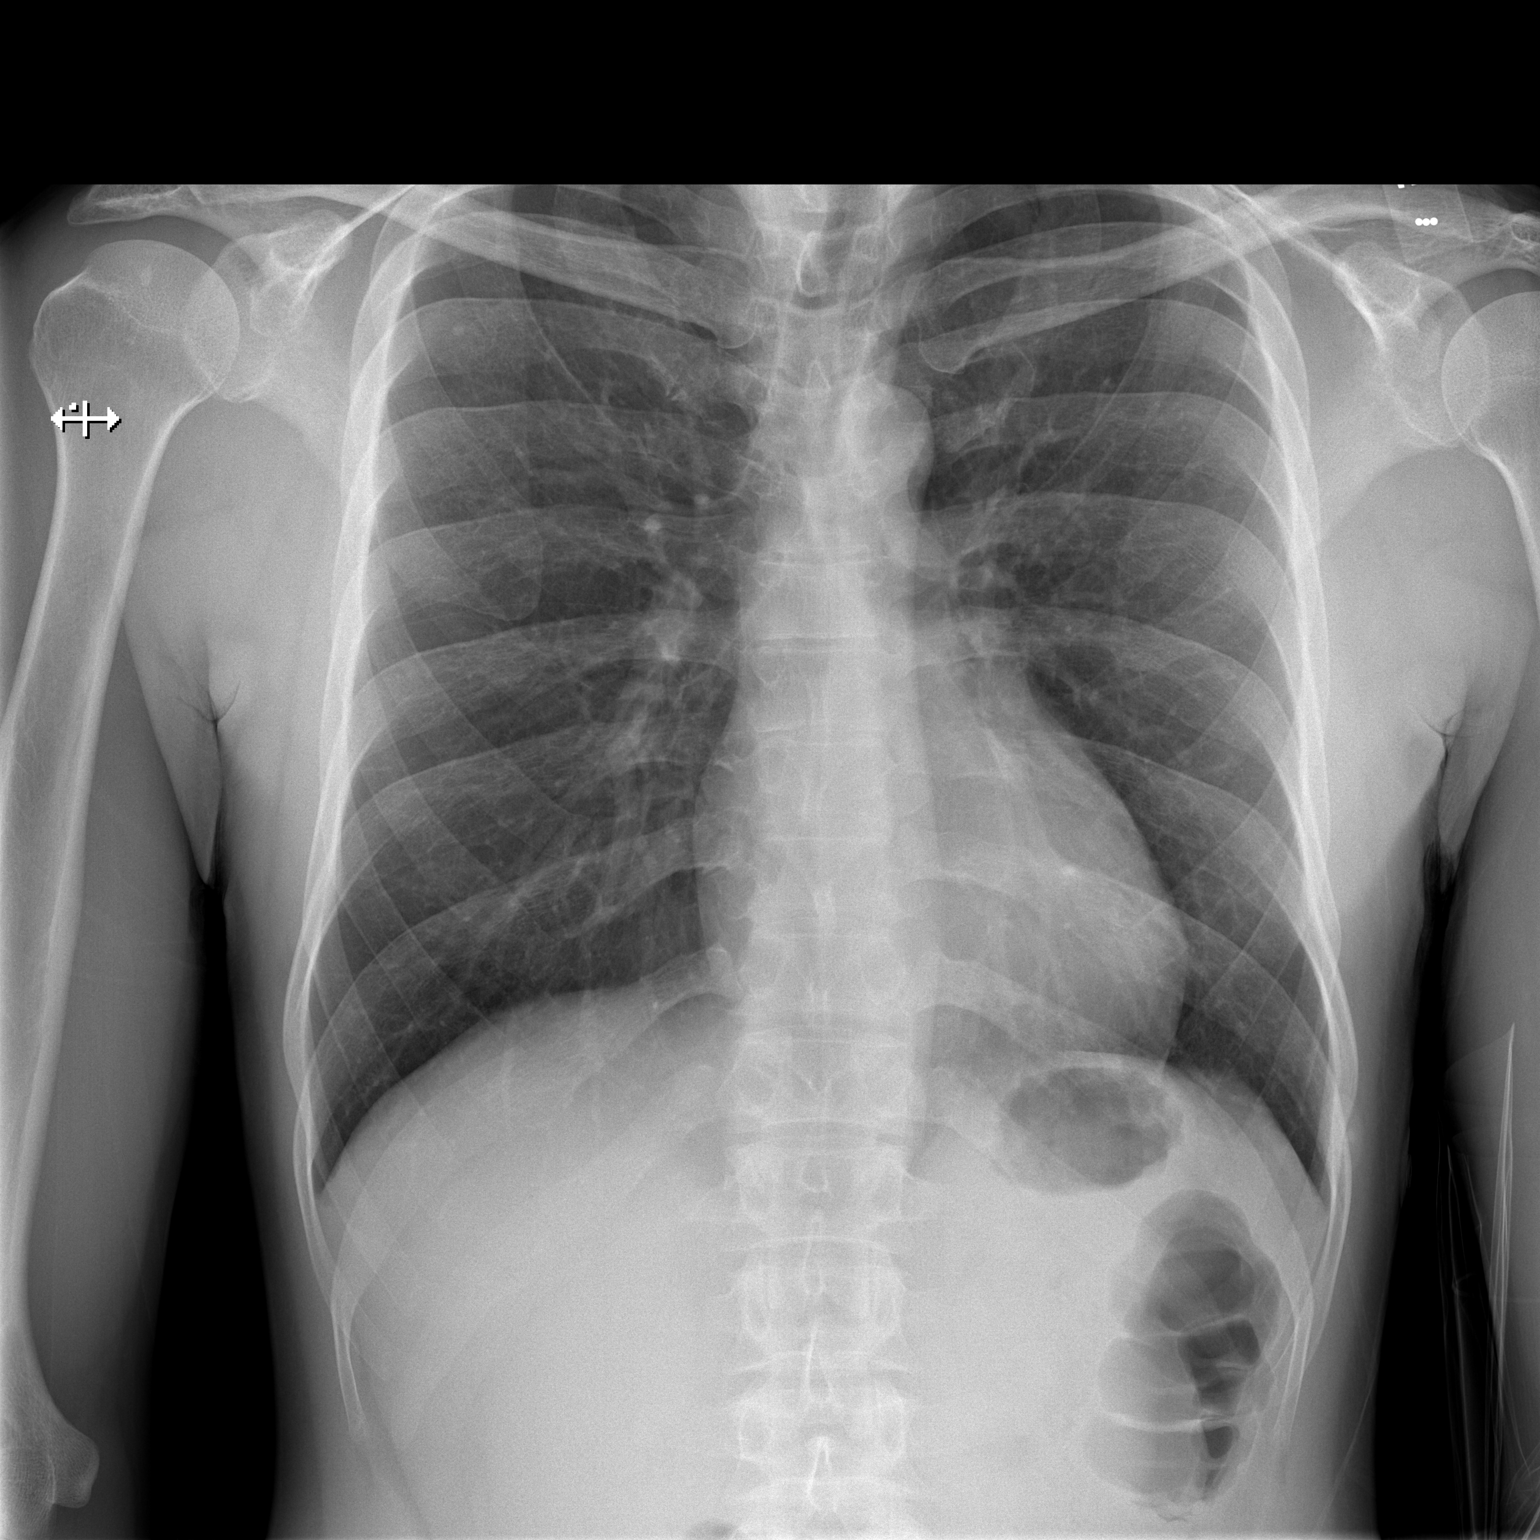

[w chest lat]
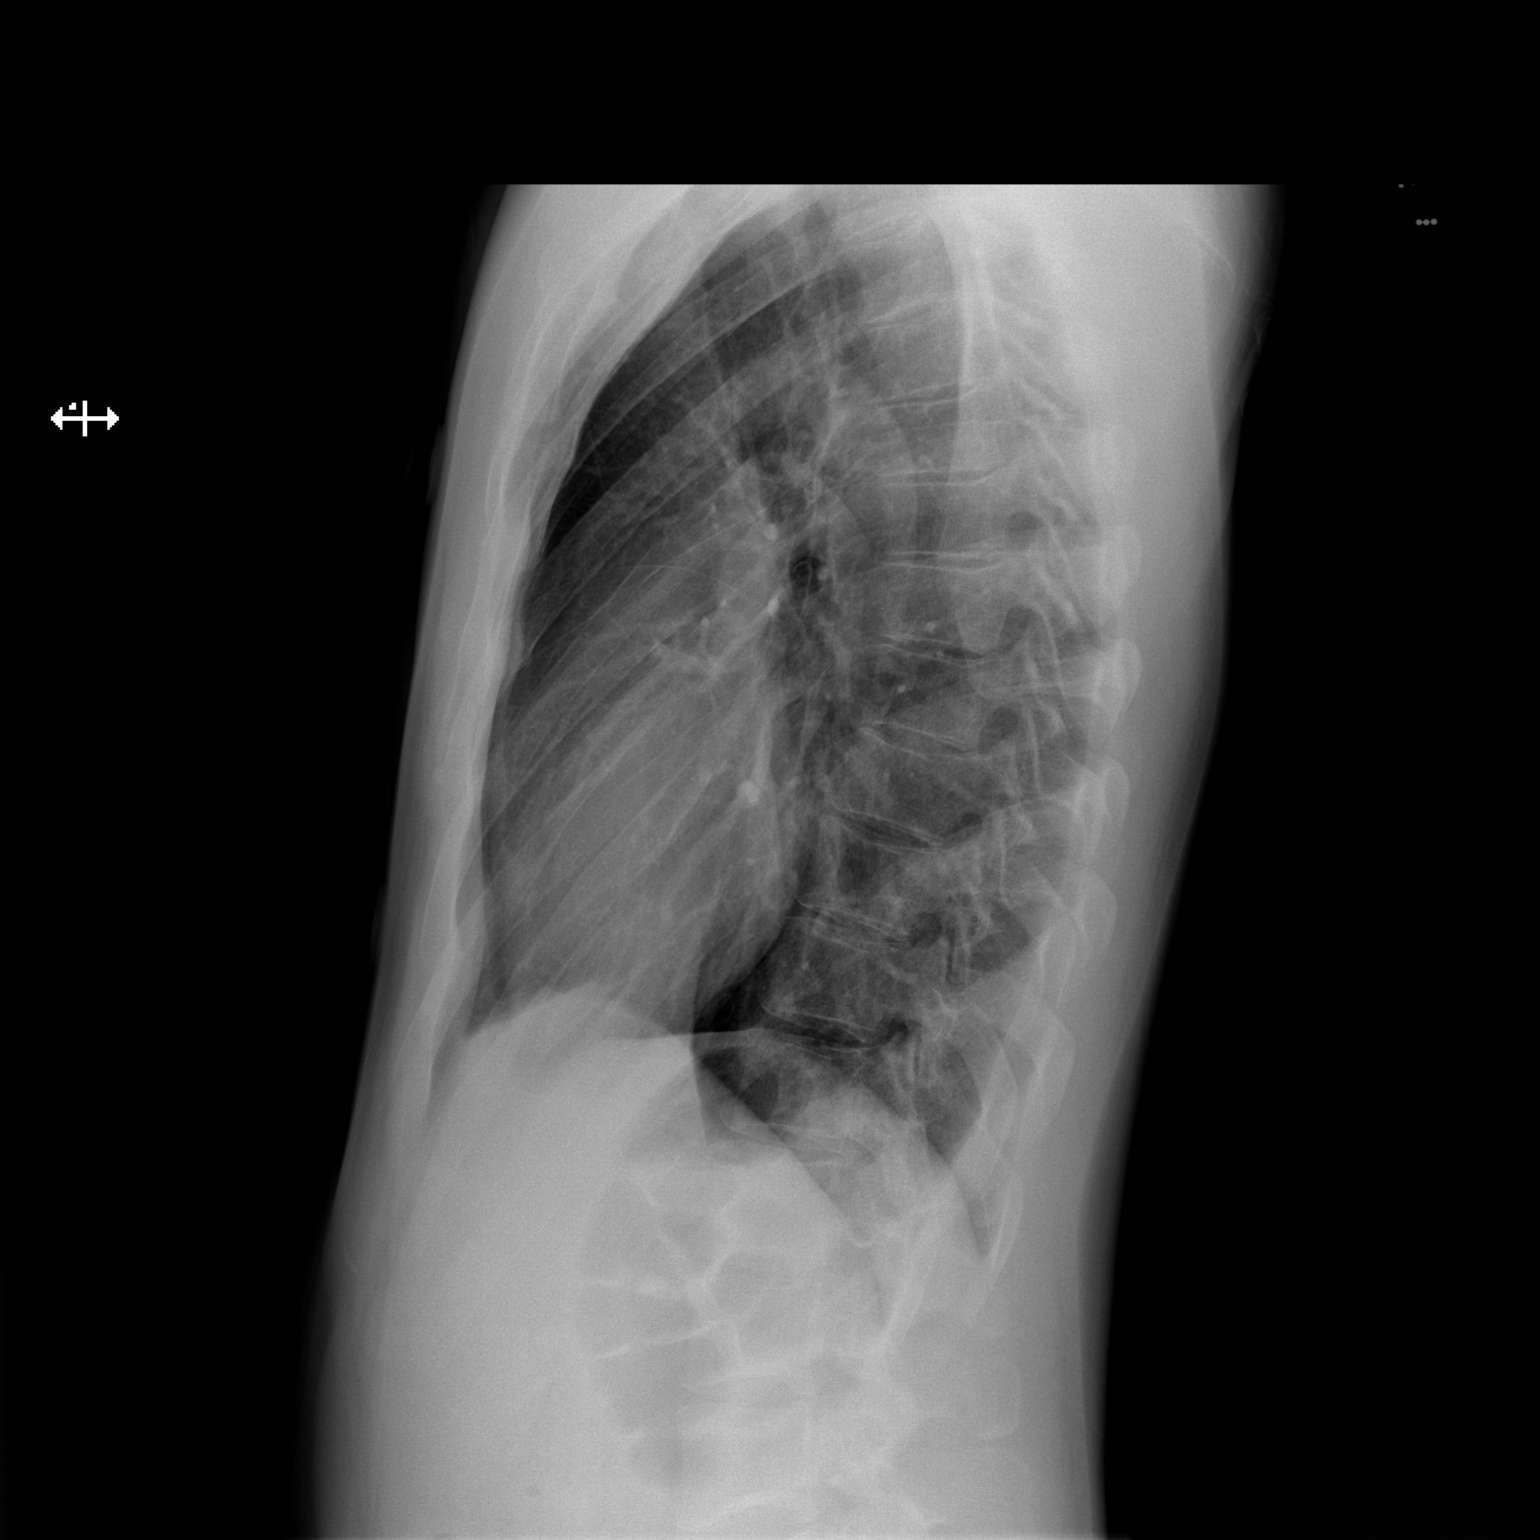

[2 of 2 positions shown; findings below may reference images not displayed]

FINDINGS: The heart size and mediastinal contours are within normal limits.
Both lungs are clear. The visualized skeletal structures are
unremarkable.
IMPRESSION: No active cardiopulmonary disease.

## 2022-12-04 ENCOUNTER — Ambulatory Visit
Admission: EM | Admit: 2022-12-04 | Discharge: 2022-12-04 | Disposition: A | Discharge status: Home / Self Care | Payer: Self-pay | Attending: Emergency Medicine | Admitting: Emergency Medicine

## 2022-12-04 DIAGNOSIS — J029 Acute pharyngitis, unspecified: Secondary | ICD-10-CM

## 2022-12-04 DIAGNOSIS — J3089 Other allergic rhinitis: Secondary | ICD-10-CM

## 2022-12-04 DIAGNOSIS — J020 Streptococcal pharyngitis: Secondary | ICD-10-CM

## 2022-12-04 DIAGNOSIS — J302 Other seasonal allergic rhinitis: Secondary | ICD-10-CM

## 2022-12-04 LAB — POCT RAPID STREP A (OFFICE): Rapid Strep A Screen: POSITIVE — AB

## 2022-12-04 MED ORDER — AMOXICILLIN 500 MG PO CAPS
500.0000 mg | ORAL_CAPSULE | Freq: Two times a day (BID) | ORAL | 0 refills | Status: AC
Start: 2022-12-04 — End: 2022-12-14

## 2022-12-04 MED ORDER — FLUTICASONE PROPIONATE 50 MCG/ACT NA SUSP: 1.0000 | Freq: Every day | NASAL | 2 refills | Status: AC

## 2022-12-04 MED ORDER — CETIRIZINE HCL 10 MG PO TABS: 10.0000 mg | ORAL_TABLET | Freq: Every day | ORAL | 1 refills | Status: AC

## 2022-12-04 NOTE — ED Triage Notes (Addendum)
Pt c/o sore throat, congestion, body aches and headache.    Started: a few days ago   Home interventions: tylenol

## 2022-12-04 NOTE — ED Provider Notes (Signed)
[facility]    CSN: [id number] Arrival date & time: 12/04/22  1208    HISTORY   Chief Complaint  Patient presents with   Generalized Body Aches   Sore Throat   Headache   Nasal Congestion   HPI Marcus Hernandez is a pleasant, 36 y.o. male who presents to urgent care today. Patient complains of a sore throat, nasal congestion, rhinorrhea, muscle aches and headache.  Patient states his symptoms initially began 7 days ago with sore throat only states at the time but only felt scratchy.  Patient states that 3 days ago his throat began to be very sore and is now very painful to swallow.  Patient states he does not have a history of allergies.  Patient states sometimes at nighttime he cannot breathe through his nose.  Patient states muscle aches, headache began about 3 days ago.  Patient states he has had symptoms for the past few days and that he has tried Tylenol, Vicks cough medicine, TheraFlu, none of which help.  Patient has a slightly elevated temperature on arrival with otherwise normal vital signs.  Patient has a history of systolic heart failure, thyromegaly.  The history is provided by the patient.   Past Medical History:  Diagnosis Date   Axillary abscess    CHF (congestive heart failure) [facility])    Patient Active Problem List   Diagnosis Date Noted   Paresthesia 06/05/2021   Liver lesion, right lobe 02/23/2021   Unintentional weight loss 02/22/2021   Dysphagia 02/22/2021   Atypical chest pain 02/22/2021   Change in bowel habits 65/99/3570   Systolic heart failure ([place]) 12/28/2020   Thyroid nodule 12/28/2020   Insomnia 12/28/2020   Thyromegaly 11/01/2020   Weight loss 11/01/2020   Pharyngoesophageal dysphagia 11/01/2020   SOB (shortness of breath) 11/01/2020   Chest pain in adult 11/01/2020   Influenza vaccine refused 10/31/2020   History reviewed. No pertinent surgical history.  Home Medications    Prior to Admission medications   Medication Sig  Start Date End Date Taking? Authorizing Provider  losartan (COZAAR) 25 MG tablet Take 0.5 tablets (12.5 mg total) by mouth daily. 01/11/22   Ladell Pier, MD  metoprolol succinate (TOPROL XL) 25 MG 24 hr tablet Take 0.5 tablets (12.5 mg total) by mouth daily. 10/29/22   Ladell Pier, MD  sertraline (ZOLOFT) 50 MG tablet 1/2 tab daily x 3 wks then 1 tab PO daily 10/31/20 12/02/20  Ladell Pier, MD    Family History Family History  Problem Relation Age of Onset   GER disease Mother    Diabetes Mother    Diabetes Father    Prostate cancer Father    Hyperthyroidism Sister    Colon cancer Neg Hx    Esophageal cancer Neg Hx    Inflammatory bowel disease Neg Hx    Liver disease Neg Hx    Pancreatic cancer Neg Hx    Rectal cancer Neg Hx    Stomach cancer Neg Hx    Social History Social History   Tobacco Use   Smoking status: Former    Types: Cigars   Smokeless tobacco: Never  Vaping Use   Vaping Use: Never used  Substance Use Topics   Alcohol use: Yes    Comment: weekly   Drug use: No   Allergies   Shrimp [shellfish allergy]  Review of Systems Review of Systems Pertinent findings revealed after performing a 14 point review of systems has been noted in  the history of present illness.  Physical Exam Triage Vital Signs ED Triage Vitals  Enc Vitals Group     BP 10/12/21 0827 (!) 147/82     Pulse Rate 10/12/21 0827 72     Resp 10/12/21 0827 18     Temp 10/12/21 0827 98.3 F (36.8 C)     Temp Source 10/12/21 0827 Oral     SpO2 10/12/21 0827 98 %     Weight --      Height --      Head Circumference --      Peak Flow --      Pain Score 10/12/21 0826 5     Pain Loc --      Pain Edu? --      Excl. in [place]? --   No data found.  Updated Vital Signs BP 107/74 (BP Location: Left Arm)   Pulse 97   Temp 99.5 F (37.5 C) (Oral)   Resp 16   SpO2 97%   Physical Exam Vitals and nursing note reviewed.  Constitutional:      General: He is awake. He is not  in acute distress.    Appearance: Normal appearance. He is well-developed, well-groomed and normal weight. He is ill-appearing.  HENT:     Head: Normocephalic and atraumatic.     Salivary Glands: Right salivary gland is diffusely enlarged. Right salivary gland is not tender. Left salivary gland is diffusely enlarged. Left salivary gland is not tender.     Right Ear: Hearing, tympanic membrane, ear canal and external ear normal. No drainage. No middle ear effusion. There is no impacted cerumen. Tympanic membrane is not erythematous or bulging.     Left Ear: Hearing and external ear normal. No drainage.  No middle ear effusion. There is no impacted cerumen. Tympanic membrane is injected, erythematous and bulging.     Ears:     Comments: Left EAC with erythema    Nose: Mucosal edema, congestion and rhinorrhea present. No nasal deformity or septal deviation. Rhinorrhea is clear.     Right Turbinates: Enlarged and swollen. Not pale.     Left Turbinates: Enlarged and swollen. Not pale.     Right Sinus: No maxillary sinus tenderness or frontal sinus tenderness.     Left Sinus: No maxillary sinus tenderness or frontal sinus tenderness.     Mouth/Throat:     Lips: Pink. No lesions.     Mouth: Mucous membranes are moist. No oral lesions.     Pharynx: Uvula midline. Pharyngeal swelling, oropharyngeal exudate, posterior oropharyngeal erythema and uvula swelling present.     Tonsils: No tonsillar exudate. 0 on the right. 0 on the left.  Eyes:     General: Lids are normal.        Right eye: No discharge.        Left eye: No discharge.     Extraocular Movements: Extraocular movements intact.     Conjunctiva/sclera: Conjunctivae normal.     Right eye: Right conjunctiva is not injected.     Left eye: Left conjunctiva is not injected.     Pupils: Pupils are equal, round, and reactive to light.  Neck:     Thyroid: Thyromegaly present.     Trachea: Trachea and phonation normal.  Cardiovascular:     Rate  and Rhythm: Normal rate and regular rhythm.     Pulses: Normal pulses.     Heart sounds: Normal heart sounds, S1 normal and S2 normal. No murmur heard.  No friction rub. No gallop.  Pulmonary:     Effort: Pulmonary effort is normal. No tachypnea, bradypnea, accessory muscle usage, prolonged expiration, respiratory distress or retractions.     Breath sounds: Normal breath sounds. No stridor, decreased air movement or transmitted upper airway sounds. No decreased breath sounds, wheezing, rhonchi or rales.  Chest:     Chest wall: No tenderness.  Musculoskeletal:        General: Normal range of motion.     Cervical back: Full passive range of motion without pain, normal range of motion and neck supple. No rigidity. No pain with movement. Normal range of motion.  Lymphadenopathy:     Cervical: Cervical adenopathy present.     Right cervical: Superficial cervical adenopathy and posterior cervical adenopathy present.     Left cervical: Superficial cervical adenopathy and posterior cervical adenopathy present.  Skin:    General: Skin is warm and dry.     Findings: No erythema or rash.  Neurological:     General: No focal deficit present.     Mental Status: He is alert and oriented to person, place, and time.  Psychiatric:        Mood and Affect: Mood normal.        Behavior: Behavior normal. Behavior is cooperative.     Visual Acuity Right Eye Distance:   Left Eye Distance:   Bilateral Distance:    Right Eye Near:   Left Eye Near:    Bilateral Near:     [facility] Couse / Diagnostics / Procedures:     Radiology No results found.  Procedures Procedures (including critical care time) EKG  Pending results:  Labs Reviewed  POCT RAPID STREP A (OFFICE) - Abnormal; Notable for the following components:      Result Value   Rapid Strep A Screen Positive (*)    All other components within normal limits    Medications Ordered in UC: Medications - No data to display  UC Diagnoses /  Final Clinical Impressions(s)   I have reviewed the triage vital signs and the nursing notes.  Pertinent labs & imaging results that were available during my care of the patient were reviewed by me and considered in my medical decision making (see chart for details).    Final diagnoses:  Acute pharyngitis, unspecified etiology  Acute streptococcal pharyngitis  Perennial allergic rhinitis with seasonal variation   Rapid strep test today is positive.  Patient provided with amoxicillin 500 mg twice daily for 10 days.  Patient provided with allergy medications for relief of nasal congestion, rhinorrhea, postnasal drip.  Return precautions advised.  Conservative care recommended.  Note provided for work.  Patient advised to discard toothbrush in replace with new toothbrush after 24 hours of antibiotics. Please see discharge instructions below for further details of plan of care as provided to patient. ED Prescriptions     Medication Sig Dispense Auth. Provider   cetirizine (ZYRTEC ALLERGY) 10 MG tablet Take 1 tablet (10 mg total) by mouth at bedtime. 90 tablet Lynden Oxford Scales, PA-C   fluticasone (FLONASE) 50 MCG/ACT nasal spray Place 1 spray into both nostrils daily. Begin by using 2 sprays in each nare daily for 3 to 5 days, then decrease to 1 spray in each nare daily. 15.8 mL Lynden Oxford Scales, PA-C   amoxicillin (AMOXIL) 500 MG capsule Take 1 capsule (500 mg total) by mouth 2 (two) times daily for 10 days. 20 capsule Lynden Oxford Scales, PA-C  PDMP not reviewed this encounter.  Disposition Upon Discharge:  Condition: stable for discharge home Home: take medications as prescribed; routine discharge instructions as discussed; follow up as advised.  Patient presented with an acute illness with associated systemic symptoms and significant discomfort requiring urgent management. In my opinion, this is a condition that a prudent lay person (someone who possesses an average  knowledge of health and medicine) may potentially expect to result in complications if not addressed urgently such as respiratory distress, impairment of bodily function or dysfunction of bodily organs.   Routine symptom specific, illness specific and/or disease specific instructions were discussed with the patient and/or caregiver at length.   As such, the patient has been evaluated and assessed, work-up was performed and treatment was provided in alignment with urgent care protocols and evidence based medicine.  Patient/parent/caregiver has been advised that the patient may require follow up for further testing and treatment if the symptoms continue in spite of treatment, as clinically indicated and appropriate.  If the patient was tested for COVID-19, Influenza and/or RSV, then the patient/parent/guardian was advised to isolate at home pending the results of his/her diagnostic coronavirus test and potentially longer if they're positive. I have also advised pt that if his/her COVID-19 test returns positive, it's recommended to self-isolate for at least 10 days after symptoms first appeared AND until fever-free for 24 hours without fever reducer AND other symptoms have improved or resolved. Discussed self-isolation recommendations as well as instructions for household member/close contacts as per the [facility] and  DHHS, and also gave patient the [facility] packet with this information.  Patient/parent/caregiver has been advised to return to the [facility] or PCP in 3-5 days if no better; to PCP or the Emergency Department if new signs and symptoms develop, or if the current signs or symptoms continue to change or worsen for further workup, evaluation and treatment as clinically indicated and appropriate  The patient will follow up with their current PCP if and as advised. If the patient does not currently have a PCP we will assist them in obtaining one.   The patient may need specialty follow up if the symptoms  continue, in spite of conservative treatment and management, for further workup, evaluation, consultation and treatment as clinically indicated and appropriate.  Patient/parent/caregiver verbalized understanding and agreement of plan as discussed.  All questions were addressed during visit.  Please see discharge instructions below for further details of plan.  Discharge Instructions:   Discharge Instructions      Your strep test today is positive.  I recommend that you begin antibiotics now for treatment.  I have sent a prescription to your pharmacy.  Please take them as prescribed.  You will begin to feel better in about 24 hours.  Please be sure that you you finish the entire 10-day course of treatment to avoid worsening infection that may require longer treatment with stronger antibiotics.   After 24 hours of taking antibiotics, you are no longer contagious.  Please discard your toothbrush as well as any other oral devices that you are currently using and replace them with new ones to avoid reinfection.   Please read below to learn more about the medications, dosages and frequencies that I recommend to help alleviate your symptoms and to get you feeling better soon:    Amoxicillin:  take 1 tablet tablet twice daily for 10 days, you can take it with or without food.  This antibiotic can cause upset stomach, this will resolve once antibiotics  are complete.  You are welcome to use a probiotic, eat yogurt, take Imodium while taking this medication.  Please avoid other systemic medications such as Maalox, Pepto-Bismol or milk of magnesia as they can interfere with your body's ability to absorb the antibiotics.       Zyrtec (cetirizine): This is an excellent second-generation antihistamine that helps to reduce respiratory inflammatory response to environmental allergens.  In some patients, this medication can cause daytime sleepiness so I recommend that you take 1 tablet daily at bedtime.      Flonase (fluticasone): This is a steroid nasal spray that you use once daily, 1 spray in each nare.  This medication does not work well if you decide to use it only used as you feel you need to, it works best used on a daily basis.  After 3 to 5 days of use, you will notice significant reduction of the inflammation and mucus production that is currently being caused by exposure to allergens, whether seasonal or environmental.  The most common side effect of this medication is nosebleeds.  If you experience a nosebleed, please discontinue use for 1 week, then feel free to resume.  I have provided you with a prescription.     If you find that you have not had significant relief of your symptoms in the next 7 to 10 days, please follow-up with your primary care provider or return here to urgent care for repeat evaluation and further recommendations.   Thank you for visiting urgent care today.  We appreciate the opportunity to participate in your care.       This office note has been dictated using Museum/gallery curator.  Unfortunately, this method of dictation can sometimes lead to typographical or grammatical errors.  I apologize for your inconvenience in advance if this occurs.  Please do not hesitate to reach out to me if clarification is needed.      Lynden [place] Scales, PA-C 12/04/22 [phone]

## 2022-12-04 NOTE — Discharge Instructions (Addendum)
Your strep test today is positive.  I recommend that you begin antibiotics now for treatment.  I have sent a prescription to your pharmacy.  Please take them as prescribed.  You will begin to feel better in about 24 hours.  Please be sure that you you finish the entire 10-day course of treatment to avoid worsening infection that may require longer treatment with stronger antibiotics.   After 24 hours of taking antibiotics, you are no longer contagious.  Please discard your toothbrush as well as any other oral devices that you are currently using and replace them with new ones to avoid reinfection.   Please read below to learn more about the medications, dosages and frequencies that I recommend to help alleviate your symptoms and to get you feeling better soon:    Amoxicillin:  take 1 tablet tablet twice daily for 10 days, you can take it with or without food.  This antibiotic can cause upset stomach, this will resolve once antibiotics are complete.  You are welcome to use a probiotic, eat yogurt, take Imodium while taking this medication.  Please avoid other systemic medications such as Maalox, Pepto-Bismol or milk of magnesia as they can interfere with your body's ability to absorb the antibiotics.       Zyrtec (cetirizine): This is an excellent second-generation antihistamine that helps to reduce respiratory inflammatory response to environmental allergens.  In some patients, this medication can cause daytime sleepiness so I recommend that you take 1 tablet daily at bedtime.     Flonase (fluticasone): This is a steroid nasal spray that you use once daily, 1 spray in each nare.  This medication does not work well if you decide to use it only used as you feel you need to, it works best used on a daily basis.  After 3 to 5 days of use, you will notice significant reduction of the inflammation and mucus production that is currently being caused by exposure to allergens, whether seasonal or environmental.   The most common side effect of this medication is nosebleeds.  If you experience a nosebleed, please discontinue use for 1 week, then feel free to resume.  I have provided you with a prescription.     If you find that you have not had significant relief of your symptoms in the next 7 to 10 days, please follow-up with your primary care provider or return here to urgent care for repeat evaluation and further recommendations.   Thank you for visiting urgent care today.  We appreciate the opportunity to participate in your care.

## 2022-12-20 ENCOUNTER — Other Ambulatory Visit: Payer: Self-pay | Admitting: Internal Medicine

## 2022-12-20 NOTE — Telephone Encounter (Signed)
Requested medication (s) are due for refill today: Yes  Requested medication (s) are on the active medication list: Yes  Last refill:  10/29/22  Future visit scheduled: Yes  Notes to clinic:  Provider sent in a 2 month supply, pharmacy requesting 90 day, provider approval needed     Requested Prescriptions  Pending Prescriptions Disp Refills   metoprolol succinate (TOPROL-XL) 25 MG 24 hr tablet [Pharmacy Med Name: METOPROLOL SUCC ER 25 MG TAB] 45 tablet 1    Sig: TAKE 1/2 TABLET BY MOUTH EVERY DAY     Cardiovascular:  Beta Blockers Failed - 12/20/2022 10:30 AM      Failed - Valid encounter within last 6 months    Recent Outpatient Visits           1 year ago GERD without esophagitis   [place], Deborah B, MD   1 year ago Unintentional weight loss   [place], Deborah B, MD   1 year ago [place], MD   2 years ago Chest pain in adult   [place], MD              Passed - Last BP in normal range    BP Readings from Last 1 Encounters:  12/04/22 107/74         Passed - Last Heart Rate in normal range    Pulse Readings from Last 1 Encounters:  12/04/22 97

## 2022-12-24 ENCOUNTER — Encounter: Payer: Self-pay | Admitting: Internal Medicine

## 2022-12-24 ENCOUNTER — Other Ambulatory Visit: Payer: Self-pay | Admitting: Internal Medicine

## 2022-12-25 NOTE — Progress Notes (Deleted)
Cardiology Office Note:    Date:  07/19/2021   ID:  Marcus Hernandez, DOB 08/19/1986, MRN [id number]  PCP:  Ladell Pier, MD  Cardiologist:  None  Electrophysiologist:  None   Referring MD: Ladell Pier, MD   No chief complaint on file.  History of Present Illness:    Marcus Hernandez is a 37 y.o. male with a hx of chronic systolic heart failure who presents for follow-up.  He was referred by Dr. Wynetta Emery for evaluation of chest pain, initially seen on 11/16/2020.  He reports that his symptoms started in October 2021.  Describes daily chest pain that occurs at different spots on the chest.  Sometimes can be left-sided, sometimes in the center, and sometimes right-sided pain.  Reports pain is either sharp or tingling sensation.  Typically just lasts for few seconds and resolves.  Sometimes the tingling sensation can last up to 20 minutes.  He does not note anything that causes it.  Does not exercise regularly but has not noted a relationship with exertion.  States that pain occurs at rest.  Also reports that he has been having shortness of breath.  Reports having lightheadedness but denies any syncope.  No lower extremity edema.  Also having palpitations that occur multiple times daily.  Palpitations started the last 2 to 3 weeks.  Occurs at rest.  States that it feels like his heart is racing during the episodes, will last for minutes.  Went to ED on 11/30 for the symptoms.  Troponins were negative.  Was prescribed metoprolol for his palpitations but has not started taking yet.  He smoked cigarettes for 2 to 3 years, 3 to 4 cigarettes/day.  Also smoked cigars for 2 to 3 years, quit in 2014.  No history of heart disease in his immediate family.  Recently found a thyroid nodules, biopsy is being planned.  Normal TSH/free T4 on 11/30.  Echocardiogram on 12/11/2020 showed LVEF 40 to 45%, global hypokinesis more pronounced in apical segment, normal RV function, no significant valvular disease.   Zio patch x7 days showed no abnormalities.  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  Coronary CTA on 01/26/2021 showed calcium score 0, significant respiratory motion artifact but no CAD seen.  Since last clinic visit, he reports he has been doing okay.  He continues to have chest pain multiple times per week.  Reports chest pain throughout his chest and usually occurs with exertion.  When he is having the pain he does notice it is worse with deep breathing.  Reports occasional lightheadedness, denies any syncope.  Denies any shortness of breath.  Reports his palpitations have improved.  BP Readings from Last 3 Encounters:  07/19/21 100/70  06/25/21 104/67  06/05/21 106/66     Past Medical History:  Diagnosis Date   Axillary abscess    CHF (congestive heart failure) (HCC)     No past surgical history on file.  Current Medications: Current Meds  Medication Sig   losartan (COZAAR) 25 MG tablet Take 0.5 tablets (12.5 mg total) by mouth daily.   [DISCONTINUED] metoprolol succinate (TOPROL XL) 25 MG 24 hr tablet Take 1 tablet (25 mg total) by mouth daily.     Allergies:   Shrimp [shellfish allergy]   Social History   Socioeconomic  History   Marital status: Single    Spouse name: Not on file   Number of children: 3   Years of education: Not on file   Highest education level: Not on file  Occupational History   Occupation: [employer]  Tobacco Use   Smoking status: Former    Types: Cigars   Smokeless tobacco: Never  Vaping Use   Vaping Use: Never used  Substance and Sexual Activity   Alcohol use: Yes    Comment: weekly   Drug use: No   Sexual activity: Not on file  Other Topics Concern   Not on file  Social History Narrative   Lives with mom and sister    Right Handed   Drinks no caffeine   Social Determinants of Health   Financial Resource Strain: Not on file  Food Insecurity: Not on file  Transportation Needs: Not on file  Physical Activity: Not on file  Stress: Not on file  Social Connections: Not on file     Family History: No known history of heart disease in his immediate family  ROS:   Please see the history of present illness.     All other systems reviewed and are negative.  EKGs/Labs/Other Studies Reviewed:    The following studies were reviewed today:   EKG:  EKG is ordered today.  The ekg ordered today demonstrates normal sinus rhythm, rate 60  Recent Labs: 02/22/2021: ALT 9; BUN 12; Creatinine, Ser 1.00; Hemoglobin 14.0; Platelets 323; Potassium 4.7; Sodium 140; TSH 1.480  Recent Lipid Panel No results found for: CHOL, TRIG, HDL, CHOLHDL, VLDL, LDLCALC, LDLDIRECT  Physical Exam:    VS:  BP 100/70 (BP Location: Left Arm, Patient Position: Sitting, Cuff Size: Normal)   Pulse 60   Ht 5' 7"$  (1.702 m)   Wt 146 lb 6.4 oz (66.4 kg)   SpO2 94%   BMI 22.93 kg/m     Wt Readings from Last 3 Encounters:  07/19/21 146 lb 6.4 oz (66.4 kg)  06/25/21 146 lb (66.2 kg)  06/05/21 146 lb (66.2 kg)     GEN: Well nourished, well developed in no acute distress HEENT: Normal NECK: No JVD; No carotid bruits CARDIAC: RRR, no murmurs, rubs, gallops RESPIRATORY:  Clear to auscultation without rales, wheezing or rhonchi  ABDOMEN: Soft, non-tender, non-distended MUSCULOSKELETAL:  No edema; No deformity  SKIN: Warm and dry NEUROLOGIC:  Alert and oriented x 3 PSYCHIATRIC:  Normal affect   ASSESSMENT:    1. Medication management   2. Chronic combined systolic and diastolic heart failure (HCC)   3. Chest pain of uncertain etiology   4. Palpitations     PLAN:    Chronic combined systolic and diastolic heart failure: reported chest pain/dyspnea.  Echocardiogram on 12/11/2020 showed LVEF 40 to 45%, global hypokinesis  more pronounced in apical segments, normal RV function, no significant valvular disease.  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  Coronary CTA on 01/26/2021 showed calcium score 0, significant respiratory motion artifact but no CAD seen.  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  ESR/CRP normal.  Suspect noncardiac chest pain, recommend PCP  follow-up for further evaluation  Palpitations: Zio patch x7 days showed no abnormalities.  Reports has significantly improved since starting on metoprolol, would continue   RTC in 6 months  Medication Adjustments/Labs and Tests Ordered: Current medicines are reviewed at length with the patient today.  Concerns regarding medicines are outlined above.  Orders Placed This Encounter  Procedures   Sed Rate (ESR)   C-reactive protein   Basic metabolic panel   EKG [id number]    Meds ordered this encounter  Medications   metoprolol succinate (TOPROL XL) 25 MG 24 hr tablet    Sig: Take 1 tablet (25 mg total) by mouth daily.    Dispense:  90 tablet    Refill:  3   losartan (COZAAR) 25 MG tablet    Sig: Take 0.5 tablets (12.5 mg total) by mouth daily.    Dispense:  45 tablet    Refill:  3     Patient Instructions  Medication Instructions:  START: LOSARTAN 12.5m DAILY  *If you need a refill on your cardiac medications before your next appointment, please call your pharmacy*  Lab Work: ESR, CRP, BMET- TODAY  If you have labs (blood work) drawn today and your tests are completely normal, you will receive your results  only by: [place](if you have MyChart) OR A paper copy in the mail If you have any lab test that is abnormal or we need to change your treatment, we will call you to review the results.  Follow-Up: [facility] and your health needs are our priority.  As part of our continuing mission to provide you with exceptional heart care, we have created designated Provider Care Teams.  These Care Teams include your primary Cardiologist (physician) and Advanced Practice Providers (APPs -  Physician Assistants and Nurse Practitioners) who all work together to provide you with the care you need, when you need it.  Your next appointment:   6 month(s)  The format for your next appointment:   In Person  Provider:   COswaldo Milian MD   Signed, CDonato Heinz MD  07/19/2021 6:16 PM    [place]

## 2022-12-27 ENCOUNTER — Other Ambulatory Visit: Payer: Self-pay

## 2022-12-27 ENCOUNTER — Ambulatory Visit: Payer: Self-pay | Admitting: Cardiology

## 2022-12-27 MED ORDER — METOPROLOL SUCCINATE ER 25 MG PO TB24: 12.5000 mg | ORAL_TABLET | Freq: Every day | ORAL | 0 refills | Status: DC

## 2022-12-29 NOTE — Progress Notes (Unsigned)
Cardiology Office Note:    Date:  07/19/2021   ID:  Marcus Hernandez, DOB 08/19/1986, MRN [id number]  PCP:  Marcus Pier, MD  Cardiologist:  None  Electrophysiologist:  None   Referring MD: Marcus Pier, MD   No chief complaint on file.  History of Present Illness:    Marcus Hernandez is a 37 y.o. male with a hx of chronic systolic heart failure who presents for follow-up.  He was referred by Dr. Wynetta Hernandez for evaluation of chest pain, initially seen on 11/16/2020.  He reports that his symptoms started in October 2021.  Describes daily chest pain that occurs at different spots on the chest.  Sometimes can be left-sided, sometimes in the center, and sometimes right-sided pain.  Reports pain is either sharp or tingling sensation.  Typically just lasts for few seconds and resolves.  Sometimes the tingling sensation can last up to 20 minutes.  He does not note anything that causes it.  Does not exercise regularly but has not noted a relationship with exertion.  States that pain occurs at rest.  Also reports that he has been having shortness of breath.  Reports having lightheadedness but denies any syncope.  No lower extremity edema.  Also having palpitations that occur multiple times daily.  Palpitations started the last 2 to 3 weeks.  Occurs at rest.  States that it feels like his heart is racing during the episodes, will last for minutes.  Went to ED on 11/30 for the symptoms.  Troponins were negative.  Was prescribed metoprolol for his palpitations but has not started taking yet.  He smoked cigarettes for 2 to 3 years, 3 to 4 cigarettes/day.  Also smoked cigars for 2 to 3 years, quit in 2014.  No history of heart disease in his immediate family.  Recently found a thyroid nodules, biopsy is being planned.  Normal TSH/free T4 on 11/30.  Echocardiogram on 12/11/2020 showed LVEF 40 to 45%, global hypokinesis more pronounced in apical segment, normal RV function, no significant valvular disease.   Zio patch x7 days showed no abnormalities.  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  Coronary CTA on 01/26/2021 showed calcium score 0, significant respiratory motion artifact but no CAD seen.  Since last clinic visit, he reports he has been doing okay.  He continues to have chest pain multiple times per week.  Reports chest pain throughout his chest and usually occurs with exertion.  When he is having the pain he does notice it is worse with deep breathing.  Reports occasional lightheadedness, denies any syncope.  Denies any shortness of breath.  Reports his palpitations have improved.  BP Readings from Last 3 Encounters:  07/19/21 100/70  06/25/21 104/67  06/05/21 106/66     Past Medical History:  Diagnosis Date   Axillary abscess    CHF (congestive heart failure) (HCC)     No past surgical history on file.  Current Medications: Current Meds  Medication Sig   losartan (COZAAR) 25 MG tablet Take 0.5 tablets (12.5 mg total) by mouth daily.   [DISCONTINUED] metoprolol succinate (TOPROL XL) 25 MG 24 hr tablet Take 1 tablet (25 mg total) by mouth daily.     Allergies:   Shrimp [shellfish allergy]   Social History   Socioeconomic  History   Marital status: Single    Spouse name: Not on file   Number of children: 3   Years of education: Not on file   Highest education level: Not on file  Occupational History   Occupation: [employer]  Tobacco Use   Smoking status: Former    Types: Cigars   Smokeless tobacco: Never  Vaping Use   Vaping Use: Never used  Substance and Sexual Activity   Alcohol use: Yes    Comment: weekly   Drug use: No   Sexual activity: Not on file  Other Topics Concern   Not on file  Social History Narrative   Lives with mom and sister    Right Handed   Drinks no caffeine   Social Determinants of Health   Financial Resource Strain: Not on file  Food Insecurity: Not on file  Transportation Needs: Not on file  Physical Activity: Not on file  Stress: Not on file  Social Connections: Not on file     Family History: No known history of heart disease in his immediate family  ROS:   Please see the history of present illness.     All other systems reviewed and are negative.  EKGs/Labs/Other Studies Reviewed:    The following studies were reviewed today:   EKG:  EKG is ordered today.  The ekg ordered today demonstrates normal sinus rhythm, rate 60  Recent Labs: 02/22/2021: ALT 9; BUN 12; Creatinine, Ser 1.00; Hemoglobin 14.0; Platelets 323; Potassium 4.7; Sodium 140; TSH 1.480  Recent Lipid Panel No results found for: CHOL, TRIG, HDL, CHOLHDL, VLDL, LDLCALC, LDLDIRECT  Physical Exam:    VS:  BP 100/70 (BP Location: Left Arm, Patient Position: Sitting, Cuff Size: Normal)   Pulse 60   Ht 5' 7"$  (1.702 m)   Wt 146 lb 6.4 oz (66.4 kg)   SpO2 94%   BMI 22.93 kg/m     Wt Readings from Last 3 Encounters:  07/19/21 146 lb 6.4 oz (66.4 kg)  06/25/21 146 lb (66.2 kg)  06/05/21 146 lb (66.2 kg)     GEN: Well nourished, well developed in no acute distress HEENT: Normal NECK: No JVD; No carotid bruits CARDIAC: RRR, no murmurs, rubs, gallops RESPIRATORY:  Clear to auscultation without rales, wheezing or rhonchi  ABDOMEN: Soft, non-tender, non-distended MUSCULOSKELETAL:  No edema; No deformity  SKIN: Warm and dry NEUROLOGIC:  Alert and oriented x 3 PSYCHIATRIC:  Normal affect   ASSESSMENT:    1. Medication management   2. Chronic combined systolic and diastolic heart failure (HCC)   3. Chest pain of uncertain etiology   4. Palpitations     PLAN:    Chronic combined systolic and diastolic heart failure: reported chest pain/dyspnea.  Echocardiogram on 12/11/2020 showed LVEF 40 to 45%, global hypokinesis  more pronounced in apical segments, normal RV function, no significant valvular disease.  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  Coronary CTA on 01/26/2021 showed calcium score 0, significant respiratory motion artifact but no CAD seen.  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  ESR/CRP normal.  Suspect noncardiac chest pain, recommend PCP  follow-up for further evaluation  Palpitations: Zio patch x7 days showed no abnormalities.  Reports has significantly improved since starting on metoprolol, would continue   RTC in 6 months  Medication Adjustments/Labs and Tests Ordered: Current medicines are reviewed at length with the patient today.  Concerns regarding medicines are outlined above.  Orders Placed This Encounter  Procedures   Sed Rate (ESR)   C-reactive protein   Basic metabolic panel   EKG [id number]    Meds ordered this encounter  Medications   metoprolol succinate (TOPROL XL) 25 MG 24 hr tablet    Sig: Take 1 tablet (25 mg total) by mouth daily.    Dispense:  90 tablet    Refill:  3   losartan (COZAAR) 25 MG tablet    Sig: Take 0.5 tablets (12.5 mg total) by mouth daily.    Dispense:  45 tablet    Refill:  3     Patient Instructions  Medication Instructions:  START: LOSARTAN 12.5m DAILY  *If you need a refill on your cardiac medications before your next appointment, please call your pharmacy*  Lab Work: ESR, CRP, BMET- TODAY  If you have labs (blood work) drawn today and your tests are completely normal, you will receive your results  only by: [place](if you have MyChart) OR A paper copy in the mail If you have any lab test that is abnormal or we need to change your treatment, we will call you to review the results.  Follow-Up: [facility] and your health needs are our priority.  As part of our continuing mission to provide you with exceptional heart care, we have created designated Provider Care Teams.  These Care Teams include your primary Cardiologist (physician) and Advanced Practice Providers (APPs -  Physician Assistants and Nurse Practitioners) who all work together to provide you with the care you need, when you need it.  Your next appointment:   6 month(s)  The format for your next appointment:   In Person  Provider:   COswaldo Milian MD   Signed, CDonato Heinz MD  07/19/2021 6:16 PM    [place]

## 2022-12-30 ENCOUNTER — Ambulatory Visit: Payer: Self-pay | Attending: Cardiology | Admitting: Cardiology

## 2022-12-30 ENCOUNTER — Encounter: Payer: Self-pay | Admitting: Cardiology

## 2022-12-30 VITALS — BP 102/58 | HR 61 | Ht 67.0 in | Wt 155.2 lb

## 2022-12-30 DIAGNOSIS — I5042 Chronic combined systolic (congestive) and diastolic (congestive) heart failure: Secondary | ICD-10-CM

## 2022-12-30 DIAGNOSIS — Z79899 Other long term (current) drug therapy: Secondary | ICD-10-CM

## 2022-12-30 MED ORDER — LOSARTAN POTASSIUM 25 MG PO TABS: 12.5000 mg | ORAL_TABLET | Freq: Every day | ORAL | 3 refills | Status: DC

## 2022-12-30 MED ORDER — METOPROLOL SUCCINATE ER 25 MG PO TB24: 12.5000 mg | ORAL_TABLET | Freq: Every day | ORAL | 3 refills | Status: DC

## 2022-12-30 NOTE — Patient Instructions (Addendum)
Medication Instructions:  START losartan 12.5 mg (1/2 tablet) daily  *If you need a refill on your cardiac medications before your next appointment, please call your pharmacy*   Lab Work: Please return for  labs in 1 week (BMET)  Our in office lab hours are Monday-Friday 8:00-4:00, closed for lunch 12:45-1:45 pm.  No appointment needed.  [facility] locations:   [place] ([place]) - [address] - [address] [place] - [address] ([facility])  Follow-Up: At [facility], you and your health needs are our priority.  As part of our continuing mission to provide you with exceptional heart care, we have created designated Provider Care Teams.  These Care Teams include your primary Cardiologist (physician) and Advanced Practice Providers (APPs -  Physician Assistants and Nurse Practitioners) who all work together to provide you with the care you need, when you need it.  We recommend signing up for the patient portal called "MyChart".  Sign up information is provided on this After Visit Summary.  MyChart is used to connect with patients for Virtual Visits (Telemedicine).  Patients are able to view lab/test results, encounter notes, upcoming appointments, etc.  Non-urgent messages can be sent to your provider as well.   To learn more about what you can do with MyChart, go to [email or web address].    Your next appointment:   3 month(s)  Provider:   Dr. Gardiner Rhyme

## 2022-12-31 ENCOUNTER — Telehealth: Payer: Self-pay | Admitting: Licensed Clinical Social Worker

## 2022-12-31 NOTE — Progress Notes (Signed)
Heart and Vascular Care Navigation  12/31/2022  Marcus Hernandez May 17, 1986 [id number]  Reason for Referral: needs additional testing, no insurance Patient is participating in a Managed Medicaid Plan: No, self pay only.  Engaged with patient by telephone for initial visit for Heart and Vascular Care Coordination.                                                                                                   Assessment:  LCSW was able to reach pt this morning at [phone]. Introduced self, role, reason for call. Pt confirmed home address, PCP, and emergency contacts. He lives with his mother and sister in their apartment. 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 Pt states he has had a change in income from 2021 with driving. Pt was denied for CAFA in 2021 ([place] financial assistance). I shared when denied you are eligible to reapply after 6 months. Pt shares no concerns with costs of housing etc, or transportation. But does have concerns about medical costs past and ongoing. LCSW explained Medicaid expansion- pt needs to go apply and see if eligible if not eligible should reapply for [employer] Financial Assistance/[place] Card. If pt also not eligible for those assistance programs then he should contact [place] Navigator marketplace assistance and see if he is eligible for other marketplace coverage. Pt states understanding, no additional questions/concerns at this time.                                        HRT/VAS Care Coordination     Patients Home Cardiology Office [place] Team Social Worker   Social Worker Name: Westley Hummer, [place], [place]   Living arrangements for the past 2 months Apartment   Lives with: Siblings; Parents   Patient Current Insurance Coverage Self-Pay   Patient Has Concern With [place] Yes   Patient Concerns With [place] has previous medical debt, ongoing medical work up   [employer] Referrals:  reapply for CAFA as was denied in 2021, Daiva Huge, [place] Expansion flyer/ACA flyer   Does Patient Have Prescription Coverage? No       Social History:                                                                             SDOH Screenings   Food Insecurity: No Food Insecurity (12/31/2022)  Housing: Low Risk  (12/31/2022)  Transportation Needs: No Transportation Needs (12/31/2022)  Utilities: Not At Risk (12/31/2022)  Depression (PHQ2-9): Medium Risk (06/25/2021)  Financial Resource Strain: Medium Risk (12/31/2022)  Tobacco Use: Medium Risk (12/30/2022)    SDOH Interventions: Financial Resources:  Financial Strain Interventions: Other (Comment) (mostly  medical debt/cost of visits) Financial Counseling for [place] Insecurity:  Food Insecurity Interventions: Intervention Not Indicated  Housing Insecurity:  Housing Interventions: Intervention Not Indicated  Transportation:   Transportation Interventions: Intervention Not Indicated    Other Care Navigation Interventions:     Provided Pharmacy assistance resources  Pt only has one medication on [facility] formulary- best option is to utilize [place]   Follow-up plan:   LCSW has mailed pt the following information- my card, Medicaid expansion flyer, CAFA application, [place] farm manager, Programme researcher, broadcasting/film/video for [employer]. I have added notes on applications about course of reviewing resources. Pt should first see if he is eligible for Medicaid under expansion, if denied he should apply for CAFA and [employer]. If denied for both then he should speak with a free navigator about his options for the marketplace if eligible. I will f/u to ensure pt has received resources and proceed from there with any additional questions/concerns.

## 2023-01-01 NOTE — Addendum Note (Signed)
Addended by: Brantley Persons A on: 01/01/2023 07:56 AM   Modules accepted: Orders

## 2023-01-03 ENCOUNTER — Other Ambulatory Visit: Payer: Self-pay

## 2023-01-03 ENCOUNTER — Telehealth: Payer: Self-pay | Admitting: Cardiology

## 2023-01-03 MED ORDER — LOSARTAN POTASSIUM 25 MG PO TABS: 12.5000 mg | ORAL_TABLET | Freq: Every day | ORAL | 3 refills | Status: DC

## 2023-01-03 MED ORDER — METOPROLOL SUCCINATE ER 25 MG PO TB24: 12.5000 mg | ORAL_TABLET | Freq: Every day | ORAL | 3 refills | Status: DC

## 2023-01-03 NOTE — Telephone Encounter (Signed)
Pt c/o medication issue:  1. Name of Medication:   losartan (COZAAR) 25 MG tablet  metoprolol succinate (TOPROL XL) 25 MG 24 hr tablet   2. How are you currently taking this medication (dosage and times per day)? Is taking metoprolol as prescribed is not taking losartan due to not having it  3. Are you having a reaction (difficulty breathing--STAT)? No   4. What is your medication issue? Patient is wanting to have the amount of medication sent to the pharmacy decreased to lower the price of the medication. Please advise.

## 2023-01-03 NOTE — Telephone Encounter (Signed)
Contacted patient, he said he would like a refill of his medications sent to a [facility] in [place]. He only wants a 30 day supply. 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   I sent RX to pharmacy as requested.

## 2023-01-08 ENCOUNTER — Telehealth: Payer: Self-pay | Admitting: Licensed Clinical Social Worker

## 2023-01-08 NOTE — Telephone Encounter (Signed)
H&V Care Navigation CSW Progress Note  Clinical Social Worker contacted patient by phone to f/u on assistance applications. LCSW was able to reach Marcus Hernandez at [phone], he shares he hasn't checked his mail in weeks so isnt sure if he received applications. LCSW encouraged Marcus Hernandez to f/u with the mail and if he has received the application to call me and let me know so I can answer any questions he may have. I will f/u in a few weeks if I do not hear back from Marcus Hernandez.   Patient is participating in a Managed Medicaid Plan:  No, self pay only.   SDOH Screenings   Food Insecurity: No Food Insecurity (12/31/2022)  Housing: Low Risk  (12/31/2022)  Transportation Needs: No Transportation Needs (12/31/2022)  Utilities: Not At Risk (12/31/2022)  Depression (PHQ2-9): Medium Risk (06/25/2021)  Financial Resource Strain: Medium Risk (12/31/2022)  Tobacco Use: Medium Risk (12/30/2022)    Westley Hummer, MSW, [place]  [phone]- work cell phone (preferred) [phone]- desk phone

## 2023-01-16 LAB — BASIC METABOLIC PANEL
BUN/Creatinine Ratio: 7 — ABNORMAL LOW (ref 9–20)
BUN: 7 mg/dL (ref 6–20)
CO2: 27 mmol/L (ref 20–29)
Calcium: 9.6 mg/dL (ref 8.7–10.2)
Chloride: 102 mmol/L (ref 96–106)
Creatinine, Ser: 1.04 mg/dL (ref 0.76–1.27)
Glucose: 94 mg/dL (ref 70–99)
Potassium: 4.5 mmol/L (ref 3.5–5.2)
Sodium: 139 mmol/L (ref 134–144)
eGFR: 95 mL/min/{1.73_m2} (ref >59)

## 2023-01-22 ENCOUNTER — Telehealth: Payer: Self-pay | Admitting: Licensed Clinical Social Worker

## 2023-01-22 ENCOUNTER — Encounter: Payer: Self-pay | Admitting: *Deleted

## 2023-01-22 NOTE — Telephone Encounter (Signed)
H&V Care Navigation CSW Progress Note  Clinical Social Worker contacted patient by phone to f/u on assistance applications. LCSW reached a voicemail today at [phone], voicemail states it is Marcus Hernandez, left voicemail requesting call back or call back with clarity if it is not pt phone. I remain available.   Patient is participating in a Managed Medicaid Plan:  No, self pay only.   SDOH Screenings   Food Insecurity: No Food Insecurity (12/31/2022)  Housing: Low Risk  (12/31/2022)  Transportation Needs: No Transportation Needs (12/31/2022)  Utilities: Not At Risk (12/31/2022)  Depression (PHQ2-9): Medium Risk (06/25/2021)  Financial Resource Strain: Medium Risk (12/31/2022)  Tobacco Use: Medium Risk (12/30/2022)   Westley Hummer, MSW, [place]  [phone]- work cell phone (preferred) [phone]- desk phone

## 2023-01-22 NOTE — Telephone Encounter (Signed)
H&V Care Navigation CSW Progress Note  Clinical Social Worker  received call back from pt [phone], confirmed DOB and he states that he goes by Clifton James)  to f/u on my voicemail. Clarified role and reason for call. Pt states he thinks he got the packet yesterday and hasn't looked at it yet. Shared that it includes orange card and CAFA. I encouraged him to first go speak with DSS about Medicaid as pt may be eligible under expansion. If not then he may be eligible again for [facility] Financial Assistance/OC assistance. Pt encouraged to call me or JT, financial counselor at Dr. Durenda Age office if any additional questions.   Patient is participating in a Managed Medicaid Plan:  No, self pay only.  SDOH Screenings   Food Insecurity: No Food Insecurity (12/31/2022)  Housing: Low Risk  (12/31/2022)  Transportation Needs: No Transportation Needs (12/31/2022)  Utilities: Not At Risk (12/31/2022)  Depression (PHQ2-9): Medium Risk (06/25/2021)  Financial Resource Strain: Medium Risk (12/31/2022)  Tobacco Use: Medium Risk (12/30/2022)   Westley Hummer, MSW, [place]  [phone]- work cell phone (preferred) [phone]- desk phone

## 2023-02-20 ENCOUNTER — Encounter: Payer: Self-pay | Admitting: Cardiology

## 2023-02-20 ENCOUNTER — Telehealth: Payer: Self-pay | Admitting: Cardiology

## 2023-02-20 ENCOUNTER — Telehealth: Payer: Self-pay

## 2023-02-20 ENCOUNTER — Other Ambulatory Visit: Payer: Self-pay

## 2023-02-20 MED ORDER — METOPROLOL SUCCINATE ER 25 MG PO TB24: 12.5000 mg | ORAL_TABLET | Freq: Every day | ORAL | 10 refills | Status: DC

## 2023-02-20 NOTE — Telephone Encounter (Signed)
Called pt to get clarification about his pharmacy. No answer, left message for him to return the call. Will send a MyChart message as well.

## 2023-02-20 NOTE — Telephone Encounter (Signed)
Patient is returning call.  °

## 2023-02-20 NOTE — Telephone Encounter (Signed)
Refill sent to preferred pharmacy. 

## 2023-04-02 NOTE — Progress Notes (Unsigned)
Cardiology Office Note:    Date:  04/03/2023   ID:  Marcus Hernandez, DOB November 24, 1986, MRN [id number]  PCP:  Marcus Matar, MD  Cardiologist:  None  Electrophysiologist:  None   Referring MD: Marcus Matar, MD   Chief Complaint  Patient presents with   Congestive Heart Failure    History of Present Illness:    Marcus Hernandez is a 37 y.o. male with a hx of chronic systolic heart failure who presents for follow-up.  He was referred by Dr. Laural Benes for evaluation of chest pain, initially seen on 11/16/2020.  He reports that his symptoms started in October 2021.  Describes daily chest pain that occurs at different spots on the chest.  Sometimes can be left-sided, sometimes in the center, and sometimes right-sided pain.  Reports pain is either sharp or tingling sensation.  Typically just lasts for few seconds and resolves.  Sometimes the tingling sensation can last up to 20 minutes.  He does not note anything that causes it.  Does not exercise regularly but has not noted a relationship with exertion.  States that pain occurs at rest.  Also reports that he has been having shortness of breath.  Reports having lightheadedness but denies any syncope.  No lower extremity edema.  Also having palpitations that occur multiple times daily.  Palpitations started the last 2 to 3 weeks.  Occurs at rest.  States that it feels like his heart is racing during the episodes, will last for minutes.  Went to ED on 11/30 for the symptoms.  Troponins were negative.  Was prescribed metoprolol for his palpitations but has not started taking yet.  He smoked cigarettes for 2 to 3 years, 3 to 4 cigarettes/day.  Also smoked cigars for 2 to 3 years, quit in 2014.  No history of heart disease in his immediate family.  Recently found a thyroid nodules, biopsy is being planned.  Normal TSH/free T4 on 11/30.  Echocardiogram on 12/11/2020 showed LVEF 40 to 45%, global hypokinesis more pronounced in apical segment, normal RV  function, no significant valvular disease.  Zio patch x7 days showed no abnormalities.  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  Coronary CTA on 01/26/2021 showed calcium score 0, significant respiratory motion artifact but no CAD seen.  Since last clinic visit, he reports he is doing okay.  He only took losartan for about 30 days and stopped taking.  Did not notice any issues with it.  Continues to have intermittent chest pain.  Denies any shortness of breath, lightheadedness, syncope, lower extremity edema, or palpitations.  He has not been exercising.  He has paperwork from Child psychotherapist for applying for Medicaid but has not filled anything out yet.   BP Readings from Last 3 Encounters:  04/03/23 104/62  12/30/22 (!) 102/58  12/04/22 107/74     Past Medical History:  Diagnosis Date   Axillary abscess    CHF (congestive heart failure)     No past surgical history on file.  Current Medications: Current Meds  Medication Sig   cetirizine (ZYRTEC ALLERGY) 10 MG tablet Take 1 tablet (10 mg total) by mouth at bedtime.   fluticasone (FLONASE) 50 MCG/ACT nasal spray Place 1 spray into both nostrils daily. Begin by using 2 sprays in each  nare daily for 3 to 5 days, then decrease to 1 spray in each nare daily.   metoprolol succinate (TOPROL XL) 25 MG 24 hr tablet Take 0.5 tablets (12.5 mg total) by mouth daily.   [DISCONTINUED] losartan (COZAAR) 25 MG tablet Take 0.5 tablets (12.5 mg total) by mouth daily.     Allergies:   Shrimp [shellfish allergy]   Social History   Socioeconomic History   Marital status: Single    Spouse name: Not on file   Number of children: 3   Years of education: Not on file   Highest education level: Not on file  Occupational History    Occupation: [employer]  Tobacco Use   Smoking status: Former    Types: Cigars   Smokeless tobacco: Never  Building services engineer Use: Never used  Substance and Sexual Activity   Alcohol use: Yes    Comment: weekly   Drug use: No   Sexual activity: Not on file  Other Topics Concern   Not on file  Social History Narrative   Lives with mom and sister   Right Handed   Drinks no caffeine   Social Determinants of Health   Financial Resource Strain: Medium Risk (12/31/2022)   Overall Financial Resource Strain (CARDIA)    Difficulty of Paying Living Expenses: Somewhat hard  Food Insecurity: No Food Insecurity (12/31/2022)   Hunger Vital Sign    Worried About Running Out of Food in the Last Year: Never true    Ran Out of Food in the Last Year: Never true  Transportation Needs: No Transportation Needs (12/31/2022)   PRAPARE - Administrator, Civil Service (Medical): No    Lack of Transportation (Non-Medical): No  Physical Activity: Not on file  Stress: Not on file  Social Connections: Not on file     Family History: No known history of heart disease in his immediate family  ROS:   Please see the history of present illness.     All other systems reviewed and are negative.  EKGs/Labs/Other Studies Reviewed:    The following studies were reviewed today:   EKG:   12/30/22: Normal sinus rhythm, rate 61, no ST abnormality   Recent Labs: 01/16/2023: BUN 7; Creatinine, Ser 1.04; Potassium 4.5; Sodium 139  Recent Lipid Panel No results found for: "CHOL", "TRIG", "HDL", "CHOLHDL", "VLDL", "LDLCALC", "LDLDIRECT"  Physical Exam:    VS:  BP 104/62 (BP Location: Left Arm, Patient Position: Sitting, Cuff Size: Large)   Pulse 87   Ht  (1.727 m)   Wt 159 lb 12.8 oz (72.5 kg)   SpO2 97%   BMI 24.30 kg/m     Wt Readings from Last 3 Encounters:  04/03/23 159 lb 12.8 oz (72.5 kg)  12/30/22 155 lb 3.2 oz (70.4 kg)  07/19/21 146 lb 6.4 oz (66.4 kg)     GEN: Well  nourished, well developed in no acute distress HEENT: Normal NECK: No JVD; No carotid bruits CARDIAC: RRR, no murmurs, rubs, gallops RESPIRATORY:  Clear to auscultation without rales, wheezing or rhonchi  ABDOMEN: Soft, non-tender, non-distended MUSCULOSKELETAL:  No edema; No deformity  SKIN: Warm and dry NEUROLOGIC:  Alert and oriented x 3 PSYCHIATRIC:  Normal affect   ASSESSMENT:    1. Chronic combined systolic and diastolic heart failure   2. Chest pain of uncertain etiology   3. Palpitations       PLAN:    Chronic combined systolic and diastolic heart failure: reported  chest pain/dyspnea.  Echocardiogram on 12/11/2020 showed LVEF 40 to 45%, global hypokinesis more pronounced in apical segments, normal RV function, no significant valvular disease.  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  Coronary CTA on 01/26/2021 showed calcium score 0, significant respiratory motion artifact but no CAD seen.  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  However, patient is self pay and would like to hold off at this time.  He is in the process of applying for Medicaid.  Will hold off on echo for now  Chest pain: Coronary CTA unremarkable as above.  ESR/CRP normal.  Suspect noncardiac chest pain, recommend PCP follow-up for further evaluation  Palpitations: Zio patch x7 days showed no abnormalities.  Reports has significantly improved since starting on metoprolol, would continue   RTC in 3 months  Medication Adjustments/Labs and Tests Ordered: Current medicines are  reviewed at length with the patient today.  Concerns regarding medicines are outlined above.  Orders Placed This Encounter  Procedures   Basic metabolic panel    Meds ordered this encounter  Medications   losartan (COZAAR) 25 MG tablet    Sig: Take 0.5 tablets (12.5 mg total) by mouth daily.    Dispense:  45 tablet    Refill:  3     Patient Instructions  Medication Instructions:  RESTART Losartan 12.5 mg daily   *If you need a refill on your cardiac medications before your next appointment, please call your pharmacy*   Lab Work: Please return for labs in 1 week (BMET)  Our in office lab hours are Monday-Friday 8:00-4:00, closed for lunch 1-2 pm.  No appointment needed.  [facility] locations:   [facility] - [address]  - [address] ([facility]) - [address] [phone] [address]   [place] - [address]    [place]  - [address]    [place] - [address] - [address] [employer]  - [address] - [address] ([facility])  Follow-Up: At [facility], you and your health needs are our priority.  As part of our continuing mission to provide you with exceptional heart care, we have created designated Provider Care Teams.  These Care Teams include your primary Cardiologist (physician) and Advanced Practice Providers (APPs -  Physician Assistants and Nurse Practitioners) who all work together to provide you with the care you need, when you need it.  We recommend signing up for the patient portal called "MyChart".  Sign up information is provided on this After Visit Summary.  MyChart is used to connect with patients for Virtual Visits (Telemedicine).  Patients are able to view lab/test results, encounter notes, upcoming appointments, etc.  Non-urgent messages can be sent to your provider as well.   To learn more  about what you can do with MyChart, go to [email or web address].    Your next appointment:   3 month(s)  Provider:   Dr. Bjorn Pippin    Signed, Little Ishikawa, MD  04/03/2023 4:19 PM    [facility]

## 2023-04-03 ENCOUNTER — Encounter: Payer: Self-pay | Admitting: Cardiology

## 2023-04-03 ENCOUNTER — Ambulatory Visit: Payer: Self-pay | Attending: Cardiology | Admitting: Cardiology

## 2023-04-03 VITALS — BP 104/62 | HR 87 | Ht 68.0 in | Wt 159.8 lb

## 2023-04-03 DIAGNOSIS — R079 Chest pain, unspecified: Secondary | ICD-10-CM

## 2023-04-03 DIAGNOSIS — R002 Palpitations: Secondary | ICD-10-CM

## 2023-04-03 DIAGNOSIS — I5042 Chronic combined systolic (congestive) and diastolic (congestive) heart failure: Secondary | ICD-10-CM

## 2023-04-03 MED ORDER — LOSARTAN POTASSIUM 25 MG PO TABS: 12.5000 mg | ORAL_TABLET | Freq: Every day | ORAL | 3 refills | Status: AC

## 2023-04-03 NOTE — Patient Instructions (Signed)
Medication Instructions:  RESTART Losartan 12.5 mg daily   *If you need a refill on your cardiac medications before your next appointment, please call your pharmacy*   Lab Work: Please return for labs in 1 week (BMET)  Our in office lab hours are Monday-Friday 8:00-4:00, closed for lunch 1-2 pm.  No appointment needed.  [facility] locations:   [facility] - [address]  - [address] ([facility]) - [address] [phone] [address]   [place] - [address]    [place]  - [address]    [place] - [address] - [address] [employer]  - [address] [place] - [address] ([facility])  Follow-Up: At [facility], you and your health needs are our priority.  As part of our continuing mission to provide you with exceptional heart care, we have created designated Provider Care Teams.  These Care Teams include your primary Cardiologist (physician) and Advanced Practice Providers (APPs -  Physician Assistants and Nurse Practitioners) who all work together to provide you with the care you need, when you need it.  We recommend signing up for the patient portal called "MyChart".  Sign up information is provided on this After Visit Summary.  MyChart is used to connect with patients for Virtual Visits (Telemedicine).  Patients are able to view lab/test results, encounter notes, upcoming appointments, etc.  Non-urgent messages can be sent to your provider as well.   To learn more about what you can do with MyChart, go to [email or web address].    Your next appointment:   3 month(s)  Provider:   Dr. Bjorn Pippin

## 2023-05-21 ENCOUNTER — Encounter: Payer: Self-pay | Admitting: Cardiology

## 2023-05-21 MED ORDER — METOPROLOL SUCCINATE ER 25 MG PO TB24: 12.5000 mg | ORAL_TABLET | Freq: Every day | ORAL | 0 refills | Status: DC

## 2023-05-21 NOTE — Telephone Encounter (Signed)
Pt's medication was sent to pt's preferred pharmacy for a 30 day supply with no refills. Confirmation received.

## 2023-07-10 ENCOUNTER — Ambulatory Visit: Payer: Self-pay | Admitting: Cardiology

## 2023-09-23 ENCOUNTER — Other Ambulatory Visit: Payer: Self-pay

## 2023-09-23 MED ORDER — METOPROLOL SUCCINATE ER 25 MG PO TB24: 12.5000 mg | ORAL_TABLET | Freq: Every day | ORAL | 6 refills | Status: DC

## 2024-04-08 ENCOUNTER — Other Ambulatory Visit: Payer: Self-pay | Admitting: Cardiology

## 2024-04-08 ENCOUNTER — Other Ambulatory Visit: Payer: Self-pay

## 2024-04-08 MED ORDER — METOPROLOL SUCCINATE ER 25 MG PO TB24
12.5000 mg | ORAL_TABLET | Freq: Every day | ORAL | 0 refills | Status: DC
Start: 2024-04-08 — End: 2024-05-24

## 2024-05-22 ENCOUNTER — Other Ambulatory Visit: Payer: Self-pay | Admitting: Cardiology

## 2024-05-24 ENCOUNTER — Telehealth: Payer: Self-pay | Admitting: Cardiology

## 2024-05-24 MED ORDER — METOPROLOL SUCCINATE ER 25 MG PO TB24: 12.5000 mg | ORAL_TABLET | Freq: Every day | ORAL | 2 refills | Status: DC

## 2024-05-24 MED ORDER — METOPROLOL SUCCINATE ER 25 MG PO TB24: 12.5000 mg | ORAL_TABLET | Freq: Every day | ORAL | 0 refills | Status: DC

## 2024-05-24 MED ORDER — METOPROLOL SUCCINATE ER 25 MG PO TB24
12.5000 mg | ORAL_TABLET | Freq: Every day | ORAL | 2 refills | Status: DC
Start: 2024-05-24 — End: 2024-05-24

## 2024-05-24 NOTE — Telephone Encounter (Signed)
*  STAT* If patient is at the pharmacy, call can be transferred to refill team.   1. Which medications need to be refilled? (please list name of each medication and dose if known)  metoprolol  succinate (TOPROL -XL) 25 MG 24 hr tablet    4. Which pharmacy/location (including street and city if local pharmacy) is medication to be sent to? [id number] Jonette Nestle, [place] - [address] AT [facility] OF [address] Phone: [phone]  Fax: [phone]       5. Do they need a 30 day or 90 day supply? 90  Pt scheduled for  08/24/24

## 2024-05-24 NOTE — Telephone Encounter (Signed)
 RX sent to requested Pharmacy

## 2024-05-24 NOTE — Telephone Encounter (Signed)
  Received call from patient who reports refill request today of Metoprolol  Succinate 12.5mg  was not sent in to pharmacy as he was lead to believe. It looks like patient is overdue for follow up. Will resend Rx and supply only enough to get patient to his currently scheduled appointment on 9/9. Will need follow up before future prescriptions.  Leala Prince, PA-C

## 2024-08-22 NOTE — Progress Notes (Unsigned)
 Cardiology Office Note:    Date:  08/24/2024   ID:  Marcus Hernandez, DOB 1986/02/07, MRN [id number]  PCP:  Vicci Barnie NOVAK, MD  Cardiologist:  None  Electrophysiologist:  None   Referring MD: Vicci Barnie NOVAK, MD   Chief Complaint  Patient presents with   Congestive Heart Failure    History of Present Illness:    Marcus Hernandez is a 38 y.o. male with a hx of chronic systolic heart failure who presents for follow-up.  He was referred by Dr. Vicci for evaluation of chest pain, initially seen on 11/16/2020.    Echocardiogram on 12/11/2020 showed LVEF 40 to 45%, global hypokinesis more pronounced in apical segment, normal RV function, no significant valvular disease.  Zio patch x7 days showed no abnormalities.  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  Coronary CTA on 01/26/2021 showed calcium score 0, significant respiratory motion artifact but no CAD seen.  Since last clinic visit, he reports he is doing okay.  Denies any dyspnea, lightheadedness, syncope, lower extremity edema, or palpitations.  Reports having intermittent chest pain, occurs randomly.  No relationship to exertion.  Chest pain can last up to a few hours.  He reports he has not been taking losartan  for months.   BP Readings from Last 3 Encounters:  08/24/24 94/60  04/03/23 104/62  12/30/22 (!) 102/58     Past Medical History:  Diagnosis Date   Axillary abscess    CHF (congestive heart failure) (HCC)     No past surgical history on file.  Current Medications: Current Meds  Medication Sig   [DISCONTINUED] metoprolol  succinate (TOPROL -XL) 25 MG 24 hr tablet Take 0.5 tablets (12.5 mg total) by mouth daily.     Allergies:   Shrimp [shellfish allergy]   Social History    Socioeconomic History   Marital status: Single    Spouse name: Not on file   Number of children: 3   Years of education: Not on file   Highest education level: Not on file  Occupational History   Occupation: [employer]  Tobacco Use   Smoking status: Former    Types: Cigars   Smokeless tobacco: Never  Vaping Use   Vaping status: Never Used  Substance and Sexual Activity   Alcohol use: Yes    Comment: weekly   Drug use: No   Sexual activity: Not on file  Other Topics Concern   Not on file  Social History Narrative   Lives with mom and sister   Right Handed   Drinks no caffeine   Social Drivers of Health   Financial Resource Strain: Medium Risk (12/31/2022)   Overall Financial Resource Strain (CARDIA)    Difficulty of Paying Living Expenses: Somewhat hard  Food Insecurity: No Food Insecurity (12/31/2022)   Hunger Vital Sign    Worried About Running Out of Food in the Last Year: Never true    Ran Out of Food in the Last Year: Never true  Transportation Needs: No Transportation Needs (12/31/2022)   PRAPARE - Administrator, Civil Service (Medical): No    Lack of Transportation (Non-Medical): No  Physical Activity: Not on file  Stress: Not on file  Social Connections: Not on file     Family History: No known history of  heart disease in his immediate family  ROS:   Please see the history of present illness.     All other systems reviewed and are negative.  EKGs/Labs/Other Studies Reviewed:    The following studies were reviewed today:   EKG:   12/30/22: Normal sinus rhythm, rate 61, no ST abnormality 08/24/2024: Normal sinus rhythm, rate 62, no ST abnormalities   Recent Labs: No results found for requested labs within last 365 days.  Recent Lipid Panel No results found for: CHOL, TRIG, HDL, CHOLHDL, VLDL, LDLCALC, LDLDIRECT  Physical Exam:    VS:  BP 94/60   Pulse 62   Ht 5' 7 (1.702 m)   Wt 162 lb (73.5 kg)   SpO2 100%   BMI  25.37 kg/m     Wt Readings from Last 3 Encounters:  08/24/24 162 lb (73.5 kg)  04/03/23 159 lb 12.8 oz (72.5 kg)  12/30/22 155 lb 3.2 oz (70.4 kg)     GEN: Well nourished, well developed in no acute distress HEENT: Normal NECK: No JVD; No carotid bruits CARDIAC: RRR, no murmurs, rubs, gallops RESPIRATORY:  Clear to auscultation without rales, wheezing or rhonchi  ABDOMEN: Soft, non-tender, non-distended MUSCULOSKELETAL:  No edema; No deformity  SKIN: Warm and dry NEUROLOGIC:  Alert and oriented x 3 PSYCHIATRIC:  Normal affect   ASSESSMENT:    1. Chronic combined systolic and diastolic heart failure (HCC)   2. Chest pain of uncertain etiology   3. Palpitations   4. Financial difficulties        PLAN:    Chronic combined systolic and diastolic heart failure: reported chest pain/dyspnea.  Echocardiogram on 12/11/2020 showed LVEF 40 to 45%, global hypokinesis more pronounced in apical segments, normal RV function, no significant valvular disease.  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  Coronary CTA on 01/26/2021 showed calcium score 0, significant respiratory motion artifact but no CAD seen.  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  However, patient is self pay and would like to hold off at this time.  He is in the process of applying for Medicaid.  Will hold off on echo for now and ask our social worker to reach out to him for assistance with Medicaid application  Chest pain: Coronary CTA unremarkable as above.   ESR/CRP normal.  Suspect noncardiac chest pain, recommend PCP follow-up for further evaluation  Palpitations: Zio patch x7 days 12/2020 showed no abnormalities.  Reports has significantly improved since starting on metoprolol , would continue   RTC in 4 months  Medication Adjustments/Labs and Tests Ordered: Current medicines are reviewed at length with the patient today.  Concerns regarding medicines are outlined above.  Orders Placed This Encounter  Procedures   Referral to HRT/VAS Care Navigation   EKG 12-Lead    Meds ordered this encounter  Medications   metoprolol  succinate (TOPROL -XL) 25 MG 24 hr tablet    Sig: Take 0.5 tablets (12.5 mg total) by mouth daily.    Dispense:  90 tablet    Refill:  3     Patient Instructions  Medication Instructions:  Your physician recommends that you continue on your current medications as directed. Please refer to the Current Medication list given to you today.  *If you need a refill on your cardiac medications before your next appointment, please call your pharmacy*  Lab Work: none If you have labs (blood work) drawn today and your tests are completely normal, you will receive your results only by: MyChart Message (if you have MyChart) OR A paper copy in the mail If you have any lab test that is abnormal or we need to change your treatment, we will call you to review the results.  Testing/Procedures: none  Follow-Up: At [facility], you and your health needs are our priority.  As part of our continuing mission to provide you with exceptional heart care, our providers are all part of one team.  This team includes your primary Cardiologist (physician) and Advanced Practice Providers or APPs (Physician Assistants and Nurse Practitioners) who all work together to provide you with the care you need, when you need it.  Your next appointment:   4 month  Provider:   Dr. Kate  We recommend signing up for the patient portal  called MyChart.  Sign up information is provided on this After Visit Summary.  MyChart is used to connect with patients for Virtual Visits (Telemedicine).  Patients are able to view lab/test results, encounter notes, upcoming appointments, etc.  Non-urgent messages can be sent to your provider as well.   To learn more about what you can do with MyChart, go to [email or web address].   Other Instructions Referral to social worker       Signed, Lonni LITTIE Kate, MD  08/24/2024 5:30 PM    [facility]

## 2024-08-24 ENCOUNTER — Ambulatory Visit: Payer: Self-pay | Attending: Cardiology | Admitting: Cardiology

## 2024-08-24 ENCOUNTER — Encounter: Payer: Self-pay | Admitting: Cardiology

## 2024-08-24 VITALS — BP 94/60 | HR 62 | Ht 67.0 in | Wt 162.0 lb

## 2024-08-24 DIAGNOSIS — I5042 Chronic combined systolic (congestive) and diastolic (congestive) heart failure: Secondary | ICD-10-CM

## 2024-08-24 DIAGNOSIS — R079 Chest pain, unspecified: Secondary | ICD-10-CM

## 2024-08-24 DIAGNOSIS — Z599 Problem related to housing and economic circumstances, unspecified: Secondary | ICD-10-CM

## 2024-08-24 DIAGNOSIS — R002 Palpitations: Secondary | ICD-10-CM

## 2024-08-24 MED ORDER — METOPROLOL SUCCINATE ER 25 MG PO TB24: 12.5000 mg | ORAL_TABLET | Freq: Every day | ORAL | 3 refills | Status: AC

## 2024-08-24 NOTE — Patient Instructions (Signed)
 Medication Instructions:  Your physician recommends that you continue on your current medications as directed. Please refer to the Current Medication list given to you today.  *If you need a refill on your cardiac medications before your next appointment, please call your pharmacy*  Lab Work: none If you have labs (blood work) drawn today and your tests are completely normal, you will receive your results only by: MyChart Message (if you have MyChart) OR A paper copy in the mail If you have any lab test that is abnormal or we need to change your treatment, we will call you to review the results.  Testing/Procedures: none  Follow-Up: At [facility], you and your health needs are our priority.  As part of our continuing mission to provide you with exceptional heart care, our providers are all part of one team.  This team includes your primary Cardiologist (physician) and Advanced Practice Providers or APPs (Physician Assistants and Nurse Practitioners) who all work together to provide you with the care you need, when you need it.  Your next appointment:   4 month  Provider:   Dr. Kate  We recommend signing up for the patient portal called MyChart.  Sign up information is provided on this After Visit Summary.  MyChart is used to connect with patients for Virtual Visits (Telemedicine).  Patients are able to view lab/test results, encounter notes, upcoming appointments, etc.  Non-urgent messages can be sent to your provider as well.   To learn more about what you can do with MyChart, go to [email or web address].   Other Instructions Referral to social worker

## 2024-08-25 ENCOUNTER — Telehealth: Payer: Self-pay | Admitting: Licensed Clinical Social Worker

## 2024-08-25 NOTE — Telephone Encounter (Signed)
 H&V Care Navigation CSW Progress Note  Clinical Social Worker contacted patient by phone to f/u on referral for self pay status. Contacted pt today at [phone] with no answer, left voicemail. Pt previously had been contacted several times (12/31/22, 01/08/23, 02/11/23) and we discussed how to apply for Medicaid and [facility] Financial Assistance and had given reminders. Pt had not f/u on recommendations at that time. Also previously denied for CAFA due to being over income.  Will re-attempt again as able.  Patient is participating in a Managed Medicaid Plan:  no, self pay only  SDOH Screenings   Food Insecurity: No Food Insecurity (12/31/2022)  Housing: Low Risk  (12/31/2022)  Transportation Needs: No Transportation Needs (12/31/2022)  Utilities: Not At Risk (12/31/2022)  Depression (PHQ2-9): Medium Risk (06/25/2021)  Financial Resource Strain: Medium Risk (12/31/2022)  Tobacco Use: Medium Risk (08/24/2024)    Marit Lark, MSW, LCSW Clinical Social Worker II [facility] Heart/Vascular Care Navigation  [phone]- work cell phone (preferred)

## 2024-08-26 ENCOUNTER — Telehealth (HOSPITAL_BASED_OUTPATIENT_CLINIC_OR_DEPARTMENT_OTHER): Payer: Self-pay | Admitting: Licensed Clinical Social Worker

## 2024-08-26 NOTE — Telephone Encounter (Signed)
 H&V Care Navigation CSW Progress Note  CClinical Social Worker contacted patient by phone to f/u on referral for self pay status. Contacted pt today again at [phone] with no answer, left 2nd voicemail. Pt previously had been contacted several times (12/31/22, 01/08/23, 02/11/23) and we discussed how to apply for Medicaid and [facility] Financial Assistance and had given reminders. Pt had not f/u on recommendations at that time. Also previously denied for CAFA due to being over income.  Will re-attempt again as able.   Patient is participating in a Managed Medicaid Plan:  no, self pay only  SDOH Screenings   Food Insecurity: No Food Insecurity (12/31/2022)  Housing: Low Risk  (12/31/2022)  Transportation Needs: No Transportation Needs (12/31/2022)  Utilities: Not At Risk (12/31/2022)  Depression (PHQ2-9): Medium Risk (06/25/2021)  Financial Resource Strain: Medium Risk (12/31/2022)  Tobacco Use: Medium Risk (08/24/2024)   Marit Lark, MSW, LCSW Clinical Social Worker II [facility] Heart/Vascular Care Navigation  [phone]- work cell phone (preferred)

## 2024-08-30 ENCOUNTER — Telehealth: Payer: Self-pay | Admitting: Licensed Clinical Social Worker

## 2024-08-30 NOTE — Telephone Encounter (Signed)
 H&V Care Navigation CSW Progress Note  Clinical Social Worker contacted patient by phone to f/u on referral for self pay status. Contacted pt today again at [phone] with no answer, left 3rd voicemail. Will send information about Medicaid, CAFA and my card to home address on file. Remain available should pt f/u with care navigation team.     Patient is participating in a Managed Medicaid Plan:  No, self pay only  SDOH Screenings   Food Insecurity: No Food Insecurity (12/31/2022)  Housing: Low Risk  (12/31/2022)  Transportation Needs: No Transportation Needs (12/31/2022)  Utilities: Not At Risk (12/31/2022)  Depression (PHQ2-9): Medium Risk (06/25/2021)  Financial Resource Strain: Medium Risk (12/31/2022)  Tobacco Use: Medium Risk (08/24/2024)    Marit Lark, MSW, LCSW Clinical Social Worker II [facility] Heart/Vascular Care Navigation  [phone]- work cell phone (preferred)

## 2024-12-07 ENCOUNTER — Encounter: Payer: Self-pay | Admitting: Cardiology
